# Patient Record
Sex: Female | Born: 1935
Health system: Southern US, Community
[De-identification: ages and names within clinical notes are randomized; demographics above are authoritative.]

## PROBLEM LIST (undated history)

## (undated) DIAGNOSIS — I10 Essential (primary) hypertension: Secondary | ICD-10-CM

## (undated) DIAGNOSIS — I129 Hypertensive chronic kidney disease with stage 1 through stage 4 chronic kidney disease, or unspecified chronic kidney disease: Secondary | ICD-10-CM

## (undated) DIAGNOSIS — Z79899 Other long term (current) drug therapy: Secondary | ICD-10-CM

## (undated) DIAGNOSIS — G459 Transient cerebral ischemic attack, unspecified: Secondary | ICD-10-CM

## (undated) DIAGNOSIS — E785 Hyperlipidemia, unspecified: Secondary | ICD-10-CM

## (undated) DIAGNOSIS — E7800 Pure hypercholesterolemia, unspecified: Secondary | ICD-10-CM

## (undated) DIAGNOSIS — N183 Chronic kidney disease, stage 3 unspecified: Secondary | ICD-10-CM

## (undated) DIAGNOSIS — F411 Generalized anxiety disorder: Secondary | ICD-10-CM

## (undated) DIAGNOSIS — R51 Headache: Secondary | ICD-10-CM

## (undated) DIAGNOSIS — E119 Type 2 diabetes mellitus without complications: Secondary | ICD-10-CM

## (undated) DIAGNOSIS — Z8673 Personal history of transient ischemic attack (TIA), and cerebral infarction without residual deficits: Secondary | ICD-10-CM

## (undated) DIAGNOSIS — I739 Peripheral vascular disease, unspecified: Secondary | ICD-10-CM

## (undated) HISTORY — DX: Essential (primary) hypertension: I10

## (undated) HISTORY — DX: Generalized anxiety disorder: F41.1

## (undated) HISTORY — DX: Hypertensive chronic kidney disease with stage 1 through stage 4 chronic kidney disease, or unspecified chronic kidney disease: I12.9

## (undated) HISTORY — DX: Transient cerebral ischemic attack, unspecified: G45.9

## (undated) HISTORY — DX: Personal history of transient ischemic attack (TIA), and cerebral infarction without residual deficits: Z86.73

## (undated) HISTORY — DX: Chronic kidney disease, stage 3 unspecified: N18.30

## (undated) HISTORY — DX: Headache: R51

## (undated) HISTORY — DX: Type 2 diabetes mellitus without complications: E11.9

## (undated) HISTORY — PX: OTHER SURGICAL HISTORY: SHX169

## (undated) HISTORY — PX: CHOLECYSTECTOMY: SHX55

## (undated) HISTORY — DX: Other long term (current) drug therapy: Z79.899

## (undated) HISTORY — DX: Peripheral vascular disease, unspecified: I73.9

## (undated) HISTORY — DX: Chronic kidney disease, stage 3 (moderate): N18.3

## (undated) HISTORY — DX: Hyperlipidemia, unspecified: E78.5

## (undated) HISTORY — DX: Pure hypercholesterolemia, unspecified: E78.00

---

## 1999-03-05 ENCOUNTER — Encounter: Payer: Self-pay | Admitting: Cardiology

## 1999-03-05 ENCOUNTER — Ambulatory Visit (HOSPITAL_COMMUNITY): Admission: RE | Admit: 1999-03-05 | Discharge: 1999-03-05 | Payer: Self-pay | Admitting: Cardiology

## 2006-08-24 ENCOUNTER — Ambulatory Visit: Payer: Self-pay | Admitting: Cardiology

## 2006-12-08 ENCOUNTER — Ambulatory Visit: Payer: Self-pay | Admitting: Cardiology

## 2006-12-23 ENCOUNTER — Ambulatory Visit: Payer: Self-pay | Admitting: Internal Medicine

## 2006-12-23 ENCOUNTER — Ambulatory Visit (HOSPITAL_COMMUNITY): Admission: RE | Admit: 2006-12-23 | Discharge: 2006-12-23 | Payer: Self-pay | Admitting: Cardiology

## 2007-01-07 ENCOUNTER — Ambulatory Visit: Payer: Self-pay | Admitting: Cardiology

## 2008-04-17 ENCOUNTER — Ambulatory Visit: Payer: Self-pay | Admitting: Cardiology

## 2010-07-09 NOTE — Assessment & Plan Note (Signed)
Dana [name]                          Dana [name]   Dana, [name]                  MRN:          [id number]  DATE:01/07/2007                            DOB:          Apr 05, 1927    The patient is seen for follow-up.  See my complete [name] of December 08, 2006.  At that time we decided to proceed with a CPX study with [name]. Bensimhon, M.D.  I do not have the formal report yet.  I do have a  preliminary report.  She actually had exercise tolerance that was  adequate compared to her age.  There was a mild ventilatory defect.  There may have been some circulatory defect.  I will need a more formal  final report and I will obtain this.  In the meantime, she felt much  better when she was pushed on the exercise study and realized that she  could exercise further and she has been going about more complete  activities.  She has not had any chest pain.  She has not had any  presyncope or syncope.   Since seeing her last, she had an episode when being asked some  questions on the telephone of decreased memory and then this became  worse as the conversation went on.  She says that Dr. Sherril Croon said she may  have had a small stroke.  On that day, around that time, she had cut  back her Aggrenox dose.   Her function has returned and she is going about full activities.   ALLERGIES:  No known drug allergies.   MEDICATIONS:  1. Aggrenox.  2. Diovan hydrochlorothiazide.   PAST MEDICAL HISTORY:  See the [name] with extensive listing on December 08, 2006.   REVIEW OF SYSTEMS:  She is doing well at this time.  Otherwise, review  of systems is negative.   PHYSICAL EXAMINATION:  VITAL SIGNS:  Blood pressure 140/78, pulse 86.  GENERAL:  The patient is oriented to person, time, and place.  Affect is  normal.  She is here with a family member today.  HEENT:  No xanthelasma.  She has normal extraocular motion.  LUNGS:  Clear.  Respiratory  effort is not labored.  HEART:  S1 with S2.  There are no clicks or significant murmurs.  ABDOMEN:  Soft.  She has no masses or bruits.   The problems are listed on my [name] of December 08, 2006.  Her shortness  of breath seems to be somewhat better.  I need to re-review her CPX with  Dr. Gala Romney to be sure that I am fully up  to date on all aspects of the results.  At this point, she is stable.  I  do want to see her back and we will arrange follow-up in three months.     Luis Abed, MD, [facility]  Electronically Signed    JDK/MedQ  DD: 01/07/2007  DT: 01/08/2007  Job #: [id number]   cc:   Doreen Beam

## 2010-07-09 NOTE — Assessment & Plan Note (Signed)
[facility]                          [facility]   Dana [name], Dana [name]                  MRN:          [id number]  DATE:12/08/2006                            DOB:          [birth year]    Dana [name] is referred by Dr. Sherril Croon for the followup of chest  tightness and shortness of breath.  He has been very carefully  evaluating this.  She had a 2D echocardiogram on August 17, 2006 that  showed some mild left ventricular hypertrophy and an ejection fraction  of 60%.  There is an atrial septal aneurysm, but no PFO and no ASD.  Right ventricular function was good.  She also underwent a stress  nuclear scan.  This study was done on August 24, 2006.  She walked on the  treadmill for 3 minutes 15 seconds.  She had shortness of breath and  felt poorly.  This is how she feels when she tries to get around.  At  that time, she had an ejection fraction of 70%.  There was no diagnostic  EKG change.  The nuclear images were normal.  There was no scar or  ischemia, and there was normal wall motion.  The patient had a CT scan  dated November 19, 2006.  This study showed that there was no aneurysm.  There was no evidence of pulmonary emboli.   The patient continues to feel this way, and she is limited by it.  She  does not have syncope or presyncope.  She saw Dr. Sherril Croon yesterday, and he  was concerned about her, and she is here for complete cardiac evaluation  now.  It is of note that the patient has had some chest pain and  question of an abnormal stress test in 2001, and actually underwent  cardiac catheterization.  The catheterization showed normal coronary  arteries and normal LV function.   PAST MEDICAL HISTORY:   ALLERGIES:  No known drug allergies.   MEDICATIONS:  Aggrenox, and Diovan/hydrochlorothiazide.   OTHER MEDICAL PROBLEMS:  See the list below.   REVIEW OF SYSTEMS:  She does not have any other complaints at this time.  She has no GI  or GU complaints.  Her review of systems otherwise is  negative.   PHYSICAL EXAMINATION:  VITAL SIGNS:  Blood pressure 135/68, with a pulse  of 78.  GENERAL:  The patient is oriented to person, time, and place.  Affect is  normal.  HEENT:  Reveals no xanthelasma.  She has normal extraocular motion.  NECK:  There are no carotid bruits.  There is no jugular venous  distention.  LUNGS:  Clear.  There is no respiratory distress.  CARDIAC:  Reveals an S1 with an S2.  There are no clicks or significant  murmurs.  ABDOMEN:  Soft.  There are no masses or bruits.  There are normal bowel  sounds.  EXTREMITIES:  There is no peripheral edema.  MUSCULOSKELETAL:  There are no musculoskeletal deformities.   EKGs sent to me reveal some mild nonspecific ST changes.   PROBLEM LIST:  1. Normal left ventricular function by echocardiogram on  August 17, 2006.  2. Atrial septal aneurysm, with no patent foramen ovale and no atrial      septal defect.  3. Mild left ventricular hypertrophy.  4. Poor exercise tolerance test by stress Cardiolite in June 2008.  5. No evidence of ischemia by Cardiolite in June 2008.  6. Question of a transient ischemic attack in the past.  7. Hypertension, treated.  8. LDL of 148.  This is on labs in August 2008.  Consideration to      adding a statin would certainly be reasonable.  9. History of normal coronary arteries by catheterization in 2001.  10.**Current exertional shortness of breath and chest tightness.  At      this point, the etiology is not clear.  I am not inclined to      recommend repeat catheterization.  She had this in 2001, and her      most recent Cardiolite revealed no ischemia when she felt poorly on      the treadmill.  I feel that a full cardiopulmonary exercise test      evaluated by Dr. Gala Romney would be very helpful.  I have explained      this to her, and this will be arranged.  I will then see her back      for followup.  11.History of  cholecystectomy.  12.Question of diabetes, diet controlled.     Luis Abed, MD, [facility]  Electronically Signed    JDK/MedQ  DD: 12/08/2006  DT: 12/09/2006  Job #: [id number]   cc:   Doreen Beam

## 2010-07-12 NOTE — Cardiovascular Report (Signed)
[facility]  Patient:    Dana [name], Dana [name]                    MRN: [id number]. Date: 03/05/99 Attending:  Cecil Cranker, M.D. [facility] CC:         Luis Abed, M.D. [facility]             Lia Hopping, M.D. - [place], [place]             Cardiac Catheterization Laboratory                        Cardiac Catheterization  PROCEDURE:  Selective coronary angiography, left ventricular angiography, abdominal aortography.  CARDIOLOGIST:  Cecil Cranker, M.D.  INDICATIONS:  The patient is a 77 year old white female with chest pain and abnormal stress test.  She has multiple risk factors.  RESULTS: LVEDP:  20.  There is no gradient on pullback from the left ventricle to the aorta.  ANGIOGRAPHY: 1. Left main coronary artery:  Is normal. 2. Left anterior descending coronary artery:  Is normal. 3. Circumflex coronary artery:  Normal. 4. Right coronary artery:  Normal. 5. Left ventricle:  Normal.  ABDOMINAL AORTOGRAPHY:  Normal.  SUMMARY: 1. Normal selective coronary angiography. 2. Normal left ventricular angiography. 3. Normal abdominal aortography. DD:  03/05/99 TD:  03/05/99 Job: [id number]

## 2010-07-12 NOTE — Assessment & Plan Note (Signed)
[facility]                          [facility]   NIMA, BAMBURG                  MRN:          [id number]  DATE:01/14/2007                            DOB:          [birth year]    I have discussed the CPX with [name]. Bensimhon, M.D. today.  The  study shows:  1. Normal functional capacity for her age.  2. Mild ventilatory abnormality probably a restrictive abnormality.  3. Mild circulatory abnormality probably from diastolic dysfunction.   Considering all of the issues and the notes in my problem list, it would  appear that the patient is not having significant ischemic as the basis  of her shortness of breath.  At this point the recommendation will be  to:  1. Not proceed with cardiac catheterization.  2. Consider pulmonary rehab.   I will see her back and discuss this.   The patient has normal functional capacity when compared to sedentary  patients of her age.  Considering all data, she appears to be stable  enough to continue with medical therapy without further aggressive  cardiac diagnostic studies.     Luis Abed, MD, [facility]  Electronically Signed    JDK/MedQ  DD: 01/14/2007  DT: 01/15/2007  Job #: [id number]

## 2010-07-12 NOTE — Assessment & Plan Note (Signed)
[facility]                          [facility]   ORLI, DEGRAVE                  MRN:          [id number]  DATE:02/02/2007                            DOB:          November 04, 1927    See my dictated note of January 14, 2007.  I called Ms. Empson  today.  I apologized for the late phone call, but explained the results  of her CPX to her.  She is doing well.  She is actually walking on the  treadmill at times.  I told her that I felt that her functional capacity  was good and that we did not need a heart catheterization, and that she  should go about full activities.  She seemed pleased by this.  I will  plan to see her back in the office in approximately 3 months, and I will  call the office to have this arranged.     Luis Abed, MD, [facility]  Electronically Signed    JDK/MedQ  DD: 02/02/2007  DT: 02/02/2007  Job #: [id number]

## 2011-10-30 DIAGNOSIS — R079 Chest pain, unspecified: Secondary | ICD-10-CM

## 2011-10-31 ENCOUNTER — Encounter: Payer: Self-pay | Admitting: Internal Medicine

## 2011-10-31 DIAGNOSIS — R079 Chest pain, unspecified: Secondary | ICD-10-CM

## 2011-11-05 ENCOUNTER — Encounter: Payer: Self-pay | Admitting: Physician Assistant

## 2011-11-05 DIAGNOSIS — R079 Chest pain, unspecified: Secondary | ICD-10-CM

## 2011-11-12 ENCOUNTER — Telehealth: Payer: Self-pay | Admitting: *Deleted

## 2011-11-12 NOTE — Telephone Encounter (Signed)
Message copied by Eustace Moore on Wed Nov 12, 2011  4:27 PM ------      Message from: Rande Brunt      Created: Fri Nov 07, 2011  1:31 PM       Abnormal stress test result. Need to review at post hospital f/u, to discuss possible cardiac cath. JK is primary MD, and she should have scheduled early f/u

## 2011-11-12 NOTE — Telephone Encounter (Signed)
Patient informed. 

## 2011-11-14 ENCOUNTER — Ambulatory Visit (INDEPENDENT_AMBULATORY_CARE_PROVIDER_SITE_OTHER): Payer: Medicare Other | Admitting: Physician Assistant

## 2011-11-14 ENCOUNTER — Encounter: Payer: Self-pay | Admitting: Physician Assistant

## 2011-11-14 ENCOUNTER — Other Ambulatory Visit: Payer: Self-pay | Admitting: Physician Assistant

## 2011-11-14 ENCOUNTER — Telehealth: Payer: Self-pay

## 2011-11-14 ENCOUNTER — Encounter: Payer: Self-pay | Admitting: *Deleted

## 2011-11-14 VITALS — BP 136/76 | HR 71 | Ht 63.0 in | Wt 132.0 lb

## 2011-11-14 DIAGNOSIS — E119 Type 2 diabetes mellitus without complications: Secondary | ICD-10-CM

## 2011-11-14 DIAGNOSIS — I10 Essential (primary) hypertension: Secondary | ICD-10-CM

## 2011-11-14 DIAGNOSIS — N183 Chronic kidney disease, stage 3 unspecified: Secondary | ICD-10-CM

## 2011-11-14 DIAGNOSIS — R079 Chest pain, unspecified: Secondary | ICD-10-CM

## 2011-11-14 DIAGNOSIS — R072 Precordial pain: Secondary | ICD-10-CM

## 2011-11-14 DIAGNOSIS — Z01810 Encounter for preprocedural cardiovascular examination: Secondary | ICD-10-CM

## 2011-11-14 DIAGNOSIS — E7800 Pure hypercholesterolemia, unspecified: Secondary | ICD-10-CM | POA: Insufficient documentation

## 2011-11-14 LAB — PROTIME-INR

## 2011-11-14 NOTE — Patient Instructions (Addendum)
   Left JV heart cath - see info sheet given  Increase fluid intake  Follow up will be given after above

## 2011-11-14 NOTE — Assessment & Plan Note (Signed)
Currently stable on no medical therapy

## 2011-11-14 NOTE — Telephone Encounter (Signed)
Left JV heart cath - Wednesday, 9/25 - 7:30 - cooper

## 2011-11-14 NOTE — Progress Notes (Signed)
Primary Cardiologist: Jerral Bonito, MD   HPI: Post hospital followup from [facility], recently seen in consultation for exertional CP. Patient presented with history of normal cardiac catheterization in 2001, following false-positive stress test. She had a normal GXT Myoview, 9/11, at Dr. Sherril Croon' office. She was last seen here in our clinic in 2008, by Dr. Myrtis Ser.  Patient ruled out for MI with normal cardiac markers. 2-D echo indicated EF greater than 65%, with diastolic dysfunction, no significant valvular abnormalities.  Patient was cleared to proceed with outpatient GXT Myoview, completed 9/11, and reviewed by Dr. Diona Browner.   - Equivocal LV perfusion with small, inferobasal reversible defect - question mild ischemia; EF 86%  Since her recent hospitalization, patient has refrained from any strenuous activity. Nevertheless, she does fatigue easily, even just after 15 minutes of doing yard work. She also did experience recurrent angina pectoris at peak exercise, during her recent stress test. In summary, she states that she has noted a definite decrease in exercise tolerance over these past 3 months, or so. In fact, she is reluctant to push herself beyond doing minimal household activities, knowing that doing so would precipitate her chest pain.  12-lead EKG today, reviewed by me, indicates NSR 72 bpm; LAD; no acute changes  Allergies  Allergen Reactions  . Paxil (Paroxetine Hcl) Other (See Comments)    Made her feel very tired    Current Outpatient Prescriptions  Medication Sig Dispense Refill  . dipyridamole-aspirin (AGGRENOX) 200-25 MG per 12 hr capsule Take 1 capsule by mouth 2 (two) times daily.      . Multiple Vitamins-Minerals (CENTRUM SILVER ADULT 50+ PO) Take 1 tablet by mouth daily.      . pantoprazole (PROTONIX) 40 MG tablet Take 40 mg by mouth daily.      . simvastatin (ZOCOR) 20 MG tablet Take 20 mg by mouth every evening.        Past Medical History  Diagnosis Date  . Unspecified  transient cerebral ischemia   . Pure hypercholesterolemia   . Type II or unspecified type diabetes mellitus without mention of complication, not stated as uncontrolled   . Encounter for long-term (current) use of other medications   . Unspecified hypertensive kidney disease with chronic kidney disease stage I through stage IV, or unspecified   . Chronic kidney disease, stage III (moderate)   . Other and unspecified hyperlipidemia   . Peripheral vascular disease, unspecified   . Headache   . Anxiety state, unspecified   . Transient ischemic attack (TIA), and cerebral infarction without residual deficits   . HTN (hypertension)     Past Surgical History  Procedure Date  . Cholecystectomy   . Cataract surgery     History   Social History  . Marital Status: Divorced    Spouse Name: N/A    Number of Children: N/A  . Years of Education: N/A   Occupational History  . Not on file.   Social History Main Topics  . Smoking status: Never Smoker   . Smokeless tobacco: Never Used  . Alcohol Use: Not on file  . Drug Use: Not on file  . Sexually Active: Not on file   Other Topics Concern  . Not on file   Social History Narrative  . No narrative on file    No family history on file.  ROS: no nausea, vomiting; no fever, chills; no melena, hematochezia; no claudication  PHYSICAL EXAM: BP 136/76  Pulse 71  Ht 5\' 3"  (1.6 m)  Wt 132 lb (59.875 kg)  BMI 23.38 kg/m2 GENERAL: 78 year old female; NAD HEENT: NCAT, PERRLA, EOMI; sclera clear; no xanthelasma NECK: palpable bilateral carotid pulses, no bruits; no JVD; no TM LUNGS: CTA bilaterally CARDIAC: RRR (S1, S2); no significant murmurs; no rubs or gallops ABDOMEN: soft, non-tender; intact BS EXTREMETIES: intact distal pulses; no significant peripheral edema SKIN: warm/dry; no obvious rash/lesions MUSCULOSKELETAL: no joint deformity NEURO: no focal deficit; NL affect   EKG: reviewed and available in Electronic  Records   ASSESSMENT & PLAN:  Chest pain Recommendation is to proceed with diagnostic coronary angiography to rule out significant CAD. This will be scheduled early next week in our JV catheterization lab. The patient is agreeable with this recommendation, and the risks/benefits were discussed in conjunction with Dr. Willa Rough. Patient has normal LVF by echocardiography, and LVG will be deferred (secondary to CKD)  HTN (hypertension) Currently stable on no medical therapy  Pure hypercholesterolemia Continue current dose simvastatin, pending results of catheterization.  Type II or unspecified type diabetes mellitus without mention of complication, not stated as uncontrolled Followed by primary M.D.  Chronic kidney disease, stage III (moderate) In hospital labs notable for creatinine 1.4 (GFR 36) Patient has normal LVF by echocardiography, and LVG will be deferred.    Gene Avy Barlett, PAC

## 2011-11-14 NOTE — Assessment & Plan Note (Addendum)
Recommendation is to proceed with diagnostic coronary angiography to rule out significant CAD. This will be scheduled early next week in our JV catheterization lab. The patient is agreeable with this recommendation, and the risks/benefits were discussed in conjunction with Dr. Willa Rough. Patient has normal LVF by echocardiography, and LVG will be deferred (secondary to CKD)

## 2011-11-14 NOTE — Assessment & Plan Note (Signed)
Continue current dose simvastatin, pending results of catheterization.

## 2011-11-14 NOTE — Assessment & Plan Note (Signed)
Followed by primary M.D. 

## 2011-11-14 NOTE — Assessment & Plan Note (Signed)
In hospital labs notable for creatinine 1.4 (GFR 36) Patient has normal LVF by echocardiography, and LVG will be deferred.

## 2011-11-18 NOTE — Telephone Encounter (Signed)
Authorized.  See media mgr

## 2011-11-19 ENCOUNTER — Encounter (HOSPITAL_BASED_OUTPATIENT_CLINIC_OR_DEPARTMENT_OTHER)
Admission: RE | Disposition: A | Discharge status: Home / Self Care | Payer: Self-pay | Source: Ambulatory Visit | Attending: Cardiovascular Disease

## 2011-11-19 ENCOUNTER — Encounter (HOSPITAL_BASED_OUTPATIENT_CLINIC_OR_DEPARTMENT_OTHER): Payer: Self-pay | Admitting: *Deleted

## 2011-11-19 ENCOUNTER — Inpatient Hospital Stay (HOSPITAL_BASED_OUTPATIENT_CLINIC_OR_DEPARTMENT_OTHER)
Admission: RE | Admit: 2011-11-19 | Discharge: 2011-11-19 | Disposition: A | Discharge status: Home / Self Care | Payer: Medicare Other | Source: Ambulatory Visit | Attending: Cardiovascular Disease | Admitting: Cardiovascular Disease

## 2011-11-19 DIAGNOSIS — I739 Peripheral vascular disease, unspecified: Secondary | ICD-10-CM | POA: Insufficient documentation

## 2011-11-19 DIAGNOSIS — I2510 Atherosclerotic heart disease of native coronary artery without angina pectoris: Secondary | ICD-10-CM | POA: Insufficient documentation

## 2011-11-19 DIAGNOSIS — I129 Hypertensive chronic kidney disease with stage 1 through stage 4 chronic kidney disease, or unspecified chronic kidney disease: Secondary | ICD-10-CM | POA: Insufficient documentation

## 2011-11-19 DIAGNOSIS — E785 Hyperlipidemia, unspecified: Secondary | ICD-10-CM | POA: Insufficient documentation

## 2011-11-19 DIAGNOSIS — R079 Chest pain, unspecified: Secondary | ICD-10-CM

## 2011-11-19 DIAGNOSIS — E7800 Pure hypercholesterolemia, unspecified: Secondary | ICD-10-CM | POA: Insufficient documentation

## 2011-11-19 DIAGNOSIS — E119 Type 2 diabetes mellitus without complications: Secondary | ICD-10-CM | POA: Insufficient documentation

## 2011-11-19 DIAGNOSIS — Z8673 Personal history of transient ischemic attack (TIA), and cerebral infarction without residual deficits: Secondary | ICD-10-CM | POA: Insufficient documentation

## 2011-11-19 DIAGNOSIS — N183 Chronic kidney disease, stage 3 unspecified: Secondary | ICD-10-CM | POA: Insufficient documentation

## 2011-11-19 SURGERY — JV LEFT HEART CATHETERIZATION WITH CORONARY ANGIOGRAM
Anesthesia: Moderate Sedation

## 2011-11-19 MED ORDER — ONDANSETRON HCL 4 MG/2ML IJ SOLN: 4.0000 mg | Freq: Four times a day (QID) | INTRAMUSCULAR | Status: DC | PRN

## 2011-11-19 MED ORDER — ONDANSETRON HCL 4 MG/2ML IJ SOLN
4.0000 mg | Freq: Three times a day (TID) | INTRAMUSCULAR | Status: DC | PRN
  Administered 2011-11-19: 4 mg via INTRAVENOUS

## 2011-11-19 MED ORDER — DIAZEPAM 5 MG PO TABS
5.0000 mg | ORAL_TABLET | Freq: Once | ORAL | Status: AC
  Administered 2011-11-19: 5 mg via ORAL

## 2011-11-19 MED ORDER — SODIUM CHLORIDE 0.9 % IV SOLN
INTRAVENOUS | Status: DC
  Administered 2011-11-19: 07:00:00 via INTRAVENOUS

## 2011-11-19 MED ORDER — SODIUM CHLORIDE 0.9 % IJ SOLN: 3.0000 mL | INTRAMUSCULAR | Status: DC | PRN

## 2011-11-19 MED ORDER — ACETAMINOPHEN 325 MG PO TABS: 650.0000 mg | ORAL_TABLET | ORAL | Status: DC | PRN

## 2011-11-19 MED ORDER — SODIUM CHLORIDE 0.9 % IJ SOLN: 3.0000 mL | Freq: Two times a day (BID) | INTRAMUSCULAR | Status: DC

## 2011-11-19 MED ORDER — SODIUM CHLORIDE 0.9 % IV SOLN: 1.0000 mL/kg/h | INTRAVENOUS | Status: DC

## 2011-11-19 MED ORDER — SODIUM CHLORIDE 0.9 % IV SOLN
250.0000 mL | INTRAVENOUS | Status: DC | PRN
Start: 2011-11-19 — End: 2011-11-19

## 2011-11-19 MED ORDER — ASPIRIN 81 MG PO CHEW
324.0000 mg | CHEWABLE_TABLET | ORAL | Status: AC
  Administered 2011-11-19: 324 mg via ORAL

## 2011-11-19 NOTE — Progress Notes (Signed)
Dr. Excell Seltzer in to look at small hematoma.  Pressure actually held for 30 mins. pressurs dressing applied.

## 2011-11-19 NOTE — Progress Notes (Signed)
Ambulated to bathroom without further bleeding from right groin site.  Site remains a level 1.  Sent home with tegaderm and pressurse dressing.

## 2011-11-19 NOTE — Progress Notes (Signed)
Bedrest begins @ 0830.  Tegaderm dressing applied by Ashley Murrain, right groin site level 0.

## 2011-11-19 NOTE — CV Procedure (Signed)
   Cardiac Catheterization Procedure Note  Name: Dana [name] MRN: [id number] DOB: May 08, 1927  Procedure: Left Heart Cath, Selective Coronary Angiography, LV angiography  Indication: Exertional chest pain with CCS class III angina and an abnormal nuclear stress test   Procedural details: The right groin was prepped, draped, and anesthetized with 1% lidocaine. Using modified Seldinger technique, a 5 French sheath was introduced into the right femoral artery. Standard Judkins catheters were used for coronary angiography and left ventriculography. Catheter exchanges were performed over a guidewire. There were no immediate procedural complications. The patient was transferred to the post catheterization recovery area for further monitoring.  Procedural Findings: Hemodynamics:  AO 102/44 with a mean of 70 LV 94/13   Coronary angiography: Coronary dominance: right  Left mainstem: Widely patent without obstructive disease  Left anterior descending (LAD): Large-caliber vessel patent to the LV apex. The mid LAD has calcification with mild nonobstructive plaque estimated at 30% stenosis. There is intramyocardial bridging further down in the mid LAD. There is no high-grade obstructive disease throughout the course of the LAD  Left circumflex (LCx): Widely patent vessel supplying a small first OM branch and larger second and third OM branches without significant stenoses.  Right coronary artery (RCA): Dominant, moderate caliber vessel. Widely patent throughout. There is acute marginal branch, PDA branch, and very small PLA branch. There were no significant stenoses.  Left ventriculography: Deferred secondary to renal insufficiency  Total contrast: 40 cc  Final Conclusions:   1. Nonobstructive coronary artery disease, primarily involving the LAD 2. Angiographically normal left circumflex and right coronary artery 3. Normal left ventricular filling pressure  Recommendations: Although  patient's symptoms are typical of angina, I do not appreciate any significant obstructive CAD.  [name] 11/19/2011, 8:10 AM

## 2011-11-19 NOTE — H&P (View-Only) (Signed)
Primary Cardiologist: Jeff Katz, MD   HPI: Post hospital followup from [facility], recently seen in consultation for exertional CP. Patient presented with history of normal cardiac catheterization in 2001, following false-positive stress test. She had a normal GXT Myoview, 9/11, at Dr. Vyas' office. She was last seen here in our clinic in 2008, by Dr. Katz.  Patient ruled out for MI with normal cardiac markers. 2-D echo indicated EF greater than 65%, with diastolic dysfunction, no significant valvular abnormalities.  Patient was cleared to proceed with outpatient GXT Myoview, completed 9/11, and reviewed by Dr. McDowell.   - Equivocal LV perfusion with small, inferobasal reversible defect - question mild ischemia; EF 86%  Since her recent hospitalization, patient has refrained from any strenuous activity. Nevertheless, she does fatigue easily, even just after 15 minutes of doing yard work. She also did experience recurrent angina pectoris at peak exercise, during her recent stress test. In summary, she states that she has noted a definite decrease in exercise tolerance over these past 3 months, or so. In fact, she is reluctant to push herself beyond doing minimal household activities, knowing that doing so would precipitate her chest pain.  12-lead EKG today, reviewed by me, indicates NSR 72 bpm; LAD; no acute changes  Allergies  Allergen Reactions  . Paxil (Paroxetine Hcl) Other (See Comments)    Made her feel very tired    Current Outpatient Prescriptions  Medication Sig Dispense Refill  . dipyridamole-aspirin (AGGRENOX) 200-25 MG per 12 hr capsule Take 1 capsule by mouth 2 (two) times daily.      . Multiple Vitamins-Minerals (CENTRUM SILVER ADULT 50+ PO) Take 1 tablet by mouth daily.      . pantoprazole (PROTONIX) 40 MG tablet Take 40 mg by mouth daily.      . simvastatin (ZOCOR) 20 MG tablet Take 20 mg by mouth every evening.        Past Medical History  Diagnosis Date  . Unspecified  transient cerebral ischemia   . Pure hypercholesterolemia   . Type II or unspecified type diabetes mellitus without mention of complication, not stated as uncontrolled   . Encounter for long-term (current) use of other medications   . Unspecified hypertensive kidney disease with chronic kidney disease stage I through stage IV, or unspecified   . Chronic kidney disease, stage III (moderate)   . Other and unspecified hyperlipidemia   . Peripheral vascular disease, unspecified   . Headache   . Anxiety state, unspecified   . Transient ischemic attack (TIA), and cerebral infarction without residual deficits   . HTN (hypertension)     Past Surgical History  Procedure Date  . Cholecystectomy   . Cataract surgery     History   Social History  . Marital Status: Divorced    Spouse Name: N/A    Number of Children: N/A  . Years of Education: N/A   Occupational History  . Not on file.   Social History Main Topics  . Smoking status: Never Smoker   . Smokeless tobacco: Never Used  . Alcohol Use: Not on file  . Drug Use: Not on file  . Sexually Active: Not on file   Other Topics Concern  . Not on file   Social History Narrative  . No narrative on file    No family history on file.  ROS: no nausea, vomiting; no fever, chills; no melena, hematochezia; no claudication  PHYSICAL EXAM: BP 136/76  Pulse 71  Ht 5' 3" (1.6 m)    Wt 132 lb (59.875 kg)  BMI 23.38 kg/m2 GENERAL: 78-year-old female; NAD HEENT: NCAT, PERRLA, EOMI; sclera clear; no xanthelasma NECK: palpable bilateral carotid pulses, no bruits; no JVD; no TM LUNGS: CTA bilaterally CARDIAC: RRR (S1, S2); no significant murmurs; no rubs or gallops ABDOMEN: soft, non-tender; intact BS EXTREMETIES: intact distal pulses; no significant peripheral edema SKIN: warm/dry; no obvious rash/lesions MUSCULOSKELETAL: no joint deformity NEURO: no focal deficit; NL affect   EKG: reviewed and available in Electronic  Records   ASSESSMENT & PLAN:  Chest pain Recommendation is to proceed with diagnostic coronary angiography to rule out significant CAD. This will be scheduled early next week in our JV catheterization lab. The patient is agreeable with this recommendation, and the risks/benefits were discussed in conjunction with Dr. Jeffrey Katz. Patient has normal LVF by echocardiography, and LVG will be deferred (secondary to CKD)  HTN (hypertension) Currently stable on no medical therapy  Pure hypercholesterolemia Continue current dose simvastatin, pending results of catheterization.  Type II or unspecified type diabetes mellitus without mention of complication, not stated as uncontrolled Followed by primary M.D.  Chronic kidney disease, stage III (moderate) In hospital labs notable for creatinine 1.4 (GFR 36) Patient has normal LVF by echocardiography, and LVG will be deferred.    Gene Hondo Nanda, PAC  

## 2011-11-19 NOTE — Progress Notes (Signed)
Noticed a small hematoma on right groin site about 2 inches by 2 inches.  Pressurse held for 15 minutes and a pressure bandage applied.

## 2011-11-19 NOTE — Interval H&P Note (Signed)
History and Physical Interval Note:  11/19/2011 7:44 AM  Dana [name]  has presented today for surgery, with the diagnosis of cp  The various methods of treatment have been discussed with the patient and family. After consideration of risks, benefits and other options for treatment, the patient has consented to  Procedure(s) (LRB) with comments: JV LEFT HEART CATHETERIZATION WITH CORONARY ANGIOGRAM (N/A) as a surgical intervention .  The patient's history has been reviewed, patient examined, no change in status, stable for surgery.  I have reviewed the patient's chart and labs.  Questions were answered to the patient's satisfaction.     [name]

## 2013-09-01 ENCOUNTER — Encounter: Payer: Self-pay | Admitting: Neurology

## 2013-09-01 ENCOUNTER — Ambulatory Visit (INDEPENDENT_AMBULATORY_CARE_PROVIDER_SITE_OTHER): Payer: Medicare Other | Admitting: Neurology

## 2013-09-01 VITALS — BP 134/64 | HR 69 | Resp 16 | Ht 63.0 in | Wt 135.0 lb

## 2013-09-01 DIAGNOSIS — R413 Other amnesia: Secondary | ICD-10-CM

## 2013-09-01 DIAGNOSIS — M792 Neuralgia and neuritis, unspecified: Secondary | ICD-10-CM

## 2013-09-01 DIAGNOSIS — R51 Headache: Secondary | ICD-10-CM

## 2013-09-01 DIAGNOSIS — IMO0002 Reserved for concepts with insufficient information to code with codable children: Secondary | ICD-10-CM

## 2013-09-01 DIAGNOSIS — R519 Headache, unspecified: Secondary | ICD-10-CM | POA: Insufficient documentation

## 2013-09-01 DIAGNOSIS — R4182 Altered mental status, unspecified: Secondary | ICD-10-CM

## 2013-09-01 NOTE — Patient Instructions (Signed)
1. MRI brain without contrast 2. Routine EEG 3. Consider gabapentin if scan is normal 4. Follow-up in 1 month

## 2013-09-01 NOTE — Progress Notes (Signed)
NEUROLOGY CONSULTATION NOTE  Dana [name] MRN: [id number] DOB: May 28, 1927  Referring provider: Dr. Jerene Bears Primary care provider: Dr. Jerene Bears  Reason for consult:  headaches  Dear Dr Woody Seller:  Thank you for your kind referral of Dana [name] for consultation of the above symptoms. Although her history is well known to you, please allow me to reiterate it for the purpose of our medical record. The patient was accompanied to the clinic by her daughter who also provides collateral information. Records and images were personally reviewed where available.  HISTORY OF PRESENT ILLNESS: This is a pleasant 80 year old right-handed woman with a history of hyperlipidemia, TIAs in 2006, presenting for new onset headaches that started 3 weeks ago.  She ports a squeezing pressure pain over the right vertex region, feeling like "a swelling that would explode."  If severe, she would have nausea and feel like she would pass out.  Interestingly, the pain seems to occur every time she is exposed to the sun.  When she goes in the shade, the pain resolves.  She has stayed out of the sun and wears a hat now. Yesterday she had to take an aspirin and go to sleep.  She has also been having a different kind of headache that she notices when she is standing in line with friends ordering food.  She would get a diffuse pressure in her head. She sits down and the pain resolves.  She denies any vomiting, visual changes, diplopia, dizziness, dysarthria, dysphagia, jaw claudication. No neck/back pain, focal numbness/tingling/weakness, no bowel/bladder dysfunction. She has no prior history of migraines, no family history of similar symptoms. She denies any recent changes in medications.  She has been taking Aggrenox since 2006 when she had a TIA where her speech became slurred with body weakness.  They cannot recall if weakness was focal, however her daughter had witnessed 3 of these episodes that lasted 15-20  minutes.  She states that she continues to have infrequent TIAs where she suddenly feels strange and that something is happening to her.  She reports her memory gets messed up, she cannot recall names and numbers, lasting 15 minutes. She would take an aspirin.  The last episode was 2 months ago. She notes rare episodes of a strange smell lasting a few minutes, independent of the memory lapses.  They deny any staring/unresponsive episodes, no myoclonic jerks, or waking up with tongue bite or incontinence.  She had a normal birth and early development.  There is no history of febrile convulsions, CNS infections, significant traumatic brain injury, neurosurgical procedures, or family history of seizures.  Laboratory Data: None available for review  PAST MEDICAL HISTORY: Past Medical History  Diagnosis Date  . Unspecified transient cerebral ischemia   . Pure hypercholesterolemia   . Type II or unspecified type diabetes mellitus without mention of complication, not stated as uncontrolled   . Encounter for long-term (current) use of other medications   . Unspecified hypertensive kidney disease with chronic kidney disease stage I through stage IV, or unspecified   . Chronic kidney disease, stage III (moderate)   . Other and unspecified hyperlipidemia   . Peripheral vascular disease, unspecified   . Headache(784.0)   . Anxiety state, unspecified   . Transient ischemic attack (TIA), and cerebral infarction without residual deficits(V12.54)   . HTN (hypertension)     PAST SURGICAL HISTORY: Past Surgical History  Procedure Laterality Date  . Cholecystectomy    . Cataract surgery  MEDICATIONS: Current Outpatient Prescriptions on File Prior to Visit  Medication Sig Dispense Refill  . dipyridamole-aspirin (AGGRENOX) 200-25 MG per 12 hr capsule Take 1 capsule by mouth 2 (two) times daily.      . pantoprazole (PROTONIX) 40 MG tablet Take 40 mg by mouth daily.      . simvastatin (ZOCOR) 20 MG  tablet Take 20 mg by mouth every evening.      . Multiple Vitamins-Minerals (CENTRUM SILVER ADULT 50+ PO) Take 1 tablet by mouth daily.       No current facility-administered medications on file prior to visit.    ALLERGIES: Allergies  Allergen Reactions  . Paxil [Paroxetine Hcl] Other (See Comments)    Made her feel very tired    FAMILY HISTORY: Family History  Problem Relation Age of Onset  . Cancer Father   . Cancer Sister   . Cancer Brother   . Diabetes Mother   . Diabetes Father   . Cancer Daughter     SOCIAL HISTORY: History   Social History  . Marital Status: Widowed    Spouse Name: N/A    Number of Children: N/A  . Years of Education: N/A   Occupational History  . Not on file.   Social History Main Topics  . Smoking status: Never Smoker   . Smokeless tobacco: Never Used  . Alcohol Use: No  . Drug Use: No  . Sexual Activity: Not on file   Other Topics Concern  . Not on file   Social History Narrative  . No narrative on file    REVIEW OF SYSTEMS: Constitutional: No fevers, chills, or sweats, no generalized fatigue, change in appetite Eyes: No visual changes, double vision, eye pain Ear, nose and throat: No hearing loss, ear pain, nasal congestion, sore throat Cardiovascular: No chest pain, palpitations Respiratory:  No shortness of breath at rest or with exertion, wheezes GastrointestinaI: No nausea, vomiting, diarrhea, abdominal pain, fecal incontinence Genitourinary:  No dysuria, urinary retention or frequency Musculoskeletal:  No neck pain, back pain Integumentary: No rash, pruritus, skin lesions Neurological: as above Psychiatric: No depression, insomnia, anxiety Endocrine: No palpitations, fatigue, diaphoresis, mood swings, change in appetite, change in weight, increased thirst Hematologic/Lymphatic:  No anemia, purpura, petechiae. Allergic/Immunologic: no itchy/runny eyes, nasal congestion, recent allergic reactions, rashes  PHYSICAL  EXAM: Filed Vitals:   09/01/13 1022  BP: 134/64  Pulse: 69  Resp: 16   General: No acute distress Head:  Normocephalic/atraumatic, tenderness over the right occipital region. No temporal artery tenderness Eyes: Fundoscopic exam shows bilateral sharp discs, no vessel changes, exudates, or hemorrhages Neck: supple, no paraspinal tenderness, full range of motion Back: No paraspinal tenderness Heart: regular rate and rhythm Lungs: Clear to auscultation bilaterally. Vascular: No carotid bruits. Skin/Extremities: No rash, no edema Neurological Exam: Mental status: alert and oriented to person, place, and time, no dysarthria or aphasia, Fund of knowledge is appropriate.  Recent and remote memory are intact.  Attention and concentration are normal.    Able to name objects and repeat phrases. Cranial nerves: CN I: not tested CN II: pupils equal, round and reactive to light, visual fields intact, fundi unremarkable. CN III, IV, VI:  full range of motion, no nystagmus, no ptosis CN V: facial sensation intact CN VII: upper and lower face symmetric CN VIII: hearing intact to finger rub CN IX, X: gag intact, uvula midline CN XI: sternocleidomastoid and trapezius muscles intact CN XII: tongue midline Bulk & Tone: normal, no fasciculations. Motor: 5/5  throughout with no pronator drift. Sensation: decreased vibration up to both ankles, otherwise intact to light touch, cold, pin, joint position sense.  No extinction to double simultaneous stimulation.  Romberg test negative Deep Tendon Reflexes: brisk +2 throughout, no ankle clonus, negative Hoffman's sign Plantar responses: downgoing bilaterally Cerebellar: no incoordination on finger to nose, heel to shin. No dysdiadochokinesia Gait: narrow-based and steady, able to tandem walk adequately. Tremor: none  IMPRESSION: This is a pleasant 80 year old right-handed woman with a history of hyperlipidemia, TIAs that started in 2006 on Aggrenox,  presenting for new onset headaches.  MRI brain without contrast will be ordered to assess for underlying structural abnormality.  There is some tenderness to palpation over the right occipital region, suggestive of occipital neuralgia.  We discussed symptomatic treatment with gabapentin, side effects were discussed, however since symptoms appear to occur mostly when exposed to sun, she will continue with trigger avoidance for now.  The episodes described as TIA where she has brief lapses in memory with last episode 2 months ago raises the question of partial seizures.  Routine EEG will be ordered.  We discussed [place] driving laws, she knows to stop driving for any episode of loss of awareness until 6 months event-free.  She will follow-up in 1 month.  Thank you for allowing me to participate in the care of this patient. Please do not hesitate to call for any questions or concerns.   Ellouise Newer, M.D.  CC: Dr. Woody Seller

## 2013-09-06 ENCOUNTER — Ambulatory Visit (HOSPITAL_COMMUNITY)
Admission: RE | Admit: 2013-09-06 | Discharge: 2013-09-06 | Disposition: A | Discharge status: Home / Self Care | Payer: Medicare Other | Source: Ambulatory Visit | Attending: Neurology | Admitting: Neurology

## 2013-09-06 ENCOUNTER — Encounter (HOSPITAL_COMMUNITY): Payer: Self-pay

## 2013-09-06 DIAGNOSIS — IMO0002 Reserved for concepts with insufficient information to code with codable children: Secondary | ICD-10-CM | POA: Insufficient documentation

## 2013-09-06 DIAGNOSIS — R51 Headache: Secondary | ICD-10-CM | POA: Insufficient documentation

## 2013-09-06 DIAGNOSIS — R4182 Altered mental status, unspecified: Secondary | ICD-10-CM | POA: Insufficient documentation

## 2013-10-04 ENCOUNTER — Encounter: Payer: Self-pay | Admitting: Neurology

## 2013-10-04 ENCOUNTER — Ambulatory Visit (INDEPENDENT_AMBULATORY_CARE_PROVIDER_SITE_OTHER): Payer: Medicare Other | Admitting: Neurology

## 2013-10-04 VITALS — BP 130/70 | HR 70 | Temp 97.5°F | Resp 16 | Ht 62.0 in | Wt 136.4 lb

## 2013-10-04 DIAGNOSIS — R4182 Altered mental status, unspecified: Secondary | ICD-10-CM

## 2013-10-04 DIAGNOSIS — R51 Headache: Secondary | ICD-10-CM

## 2013-10-04 DIAGNOSIS — R413 Other amnesia: Secondary | ICD-10-CM

## 2013-10-04 LAB — SEDIMENTATION RATE: Sed Rate: 25 mm/hr — ABNORMAL HIGH (ref 0–22)

## 2013-10-04 MED ORDER — GABAPENTIN 300 MG PO CAPS: ORAL_CAPSULE | ORAL | Status: DC

## 2013-10-04 NOTE — Patient Instructions (Addendum)
1. Bloodwork for ESR 2. Schedule routine EEG 3. Start Gabapentin 327m: Take 1 capsule at bedtime for 1 week, then increase to 1 capsule twice a day 4. Hold off on driving when you first start the medication 5. Follow-up in 2 months

## 2013-10-04 NOTE — Progress Notes (Signed)
NEUROLOGY FOLLOW UP OFFICE NOTE  Dana [name] [id number]  HISTORY OF PRESENT ILLNESS: I had the pleasure of seeing Dana [name] in follow-up in the neurology clinic on 10/04/2013.  The patient was last seen last month for new onset headaches.  She is again accompanied by her daughter today.  Records and images were personally reviewed where available.  I personally reviewed MRI brain which did not show any acute changes, there is mild global atrophy and mild bilateral chronic microvascular change. She continues to have squeezing pressure pain in the right vertex when exposed to the sun, if severe she feels like she would pass out. This is avoided by wearing a hat, however she states she forgets to bring her hat sometimes.  No further symptoms where she suddenly feels strange and memory gets messed up, last episode occurred in April 2015. She has not scheduled the EEG yet.  HPI:  This is a pleasant 80 yo RH woman with a history of hyperlipidemia, TIAs in 2006, who presented with new onset headaches since June 2015. She reports a squeezing pressure pain over the right vertex region, feeling like "a swelling that would explode." If severe, she would have nausea and feel like she would pass out. Interestingly, the pain seems to occur every time she is exposed to the sun. When she goes in the shade, the pain resolves. She has stayed out of the sun and wears a hat now. Yesterday she had to take an aspirin and go to sleep. She has also been having a different kind of headache that she notices when she is standing in line with friends ordering food. She would get a diffuse pressure in her head. She sits down and the pain resolves. She denies any vomiting, visual changes, diplopia, dizziness, dysarthria, dysphagia, jaw claudication. No neck/back pain, focal numbness/tingling/weakness, no bowel/bladder dysfunction. She has no prior history of migraines, no family history of similar symptoms. She denies  any recent changes in medications.   She has been taking Aggrenox since 2006 when she had a TIA where her speech became slurred with body weakness. They cannot recall if weakness was focal, however her daughter had witnessed 3 of these episodes that lasted 15-20 minutes. She states that she continues to have infrequent TIAs where she suddenly feels strange and that something is happening to her. She reports her memory gets messed up, she cannot recall names and numbers, lasting 15 minutes. She would take an aspirin. The last episode was 2 months ago. She notes rare episodes of a strange smell lasting a few minutes, independent of the memory lapses. They deny any staring/unresponsive episodes, no myoclonic jerks, or waking up with tongue bite or incontinence.   She had a normal birth and early development. There is no history of febrile convulsions, CNS infections, significant traumatic brain injury, neurosurgical procedures, or family history of seizures.  PAST MEDICAL HISTORY: Past Medical History  Diagnosis Date  . Unspecified transient cerebral ischemia   . Pure hypercholesterolemia   . Type II or unspecified type diabetes mellitus without mention of complication, not stated as uncontrolled   . Encounter for long-term (current) use of other medications   . Unspecified hypertensive kidney disease with chronic kidney disease stage I through stage IV, or unspecified   . Chronic kidney disease, stage III (moderate)   . Other and unspecified hyperlipidemia   . Peripheral vascular disease, unspecified   . Headache(784.0)   . Anxiety state, unspecified   . Transient ischemic  attack (TIA), and cerebral infarction without residual deficits(V12.54)   . HTN (hypertension)     MEDICATIONS: Current Outpatient Prescriptions on File Prior to Visit  Medication Sig Dispense Refill  . dipyridamole-aspirin (AGGRENOX) 200-25 MG per 12 hr capsule Take 1 capsule by mouth 2 (two) times daily.      . Multiple  Vitamins-Minerals (CENTRUM SILVER ADULT 50+ PO) Take 1 tablet by mouth daily.      . pantoprazole (PROTONIX) 40 MG tablet Take 40 mg by mouth daily.      . simvastatin (ZOCOR) 20 MG tablet Take 20 mg by mouth every evening.      . vitamin B-12 (CYANOCOBALAMIN) 1000 MCG tablet Take 1,000 mcg by mouth daily.       No current facility-administered medications on file prior to visit.    ALLERGIES: Allergies  Allergen Reactions  . Paxil [Paroxetine Hcl] Other (See Comments)    Made her feel very tired    FAMILY HISTORY: Family History  Problem Relation Age of Onset  . Cancer Father   . Cancer Sister   . Cancer Brother   . Diabetes Mother   . Diabetes Father   . Cancer Daughter     SOCIAL HISTORY: History   Social History  . Marital Status: Widowed    Spouse Name: N/A    Number of Children: N/A  . Years of Education: N/A   Occupational History  . Not on file.   Social History Main Topics  . Smoking status: Never Smoker   . Smokeless tobacco: Never Used  . Alcohol Use: No  . Drug Use: No  . Sexual Activity: Not on file   Other Topics Concern  . Not on file   Social History Narrative  . No narrative on file    REVIEW OF SYSTEMS: Constitutional: No fevers, chills, or sweats, no generalized fatigue, change in appetite Eyes: No visual changes, double vision, eye pain Ear, nose and throat: No hearing loss, ear pain, nasal congestion, sore throat Cardiovascular: No chest pain, palpitations Respiratory:  No shortness of breath at rest or with exertion, wheezes GastrointestinaI: No nausea, vomiting, diarrhea, abdominal pain, fecal incontinence Genitourinary:  No dysuria, urinary retention or frequency Musculoskeletal:  No neck pain, back pain Integumentary: No rash, pruritus, skin lesions Neurological: as above Psychiatric: No depression, insomnia, anxiety Endocrine: No palpitations, fatigue, diaphoresis, mood swings, change in appetite, change in weight, increased  thirst Hematologic/Lymphatic:  No anemia, purpura, petechiae. Allergic/Immunologic: no itchy/runny eyes, nasal congestion, recent allergic reactions, rashes  PHYSICAL EXAM: Filed Vitals:   10/04/13 1122  BP: 130/70  Pulse: 70  Temp: 97.5 F (36.4 C)  Resp: 16   General: No acute distress Head:  Normocephalic/atraumatic, tenderness to palpation over bilateral temporal regions, no occipital tenderness today, no tenderness over the vertex Neck: supple, no paraspinal tenderness, full range of motion Heart:  Regular rate and rhythm Lungs:  Clear to auscultation bilaterally Back: No paraspinal tenderness Skin/Extremities: No rash, no edema Neurological Exam: alert and oriented to person, place, and time. No aphasia or dysarthria. Fund of knowledge is appropriate.  Recent and remote memory are intact.  Attention and concentration are normal.    Able to name objects and repeat phrases. Cranial nerves: Pupils equal, round, reactive to light.  Fundoscopic exam unremarkable, no papilledema. Extraocular movements intact with no nystagmus. Visual fields full. Facial sensation intact. No facial asymmetry. Tongue, uvula, palate midline.  Motor: Bulk and tone normal, muscle strength 5/5 throughout with no pronator drift.  Sensation decreased vibration to both ankles.  No extinction to double simultaneous stimulation.  Deep tendon reflexes brisk 2+ throughout, toes downgoing.  Finger to nose testing intact.  Gait narrow-based and steady, able to tandem walk adequately.  Romberg negative.  IMPRESSION: This is a pleasant 80 yo RH woman with a history of hyperlipidemia, TIAs that started in 2006 on Aggrenox, with new onset headaches since June 2015 that are triggered by exposure to sun.  On her last visit, there was some tenderness to palpation over the right occipital region, suggestive of occipital neuralgia. However today tenderness is over the bilateral temporal regions. Check ESR to assess for temporal  arteritis. Her MRI brain is unremarkable.  We again discussed symptomatic treatment with gabapentin, side effects were discussed, she will start on low dose with uptitration as tolerated. The episodes described as TIA where she has brief lapses in memory with last episode 2 months ago raises the question of partial seizures. Routine EEG will be ordered. We discussed  driving laws, she knows to stop driving for any episode of loss of awareness until 6 months event-free. She will follow-up in 2 months.  Thank you for allowing me to participate in her care.  Please do not hesitate to call for any questions or concerns.  The duration of this appointment visit was 25 minutes of face-to-face time with the patient.  Greater than 50% of this time was spent in counseling, explanation of diagnosis, planning of further management, and coordination of care.   Ellouise Newer, M.D.   CC: Dr. Woody Seller

## 2013-10-06 NOTE — Progress Notes (Signed)
Faxed

## 2013-10-14 ENCOUNTER — Telehealth: Payer: Self-pay | Admitting: *Deleted

## 2013-10-14 NOTE — Telephone Encounter (Signed)
Patient not tolerating her medication (gabapentin) once she increased to two daily. Advised patient to continue one at bed time until further notice. Please advise

## 2013-10-17 NOTE — Telephone Encounter (Signed)
Stay on 1 capsule at bedtime for another 2 weeks, then re-try increasing again. Thanks

## 2013-10-17 NOTE — Telephone Encounter (Signed)
She states that since increasing the Gabapentin to 2 a day states that she started feeling "drunk", she was afraid of falling. She went back to taking 1 a day on Sat. Pls advise.

## 2013-10-17 NOTE — Telephone Encounter (Signed)
Patient was notified of advisement via voicemail per her request.

## 2013-10-17 NOTE — Telephone Encounter (Signed)
Lmom to return my call. 

## 2013-10-26 ENCOUNTER — Ambulatory Visit (INDEPENDENT_AMBULATORY_CARE_PROVIDER_SITE_OTHER): Payer: Medicare Other | Admitting: Neurology

## 2013-10-26 DIAGNOSIS — R4182 Altered mental status, unspecified: Secondary | ICD-10-CM

## 2013-10-26 DIAGNOSIS — R413 Other amnesia: Secondary | ICD-10-CM

## 2013-10-26 DIAGNOSIS — R51 Headache: Secondary | ICD-10-CM

## 2013-10-27 NOTE — Procedures (Signed)
ELECTROENCEPHALOGRAM REPORT  Date of Study: 10/26/2013  Patient's Name: Dana [name] MRN: [id number] Date of Birth: 06-23-1927  Referring Provider: Dr. Ellouise Newer  Clinical History: This is an 80 year old woman with episodes of brief lapses in memory.  Medications: Aggrenox, Zocor, protonix  Technical Summary: A multichannel digital EEG recording measured by the international 10-20 system with electrodes applied with paste and impedances below 5000 ohms performed in our laboratory with EKG monitoring in an awake and drowsy patient.  Hyperventilation and photic stimulation were performed.  The digital EEG was referentially recorded, reformatted, and digitally filtered in a variety of bipolar and referential montages for optimal display.    Description: The patient is awake and drowsy during the recording.  During maximal wakefulness, there is a symmetric, medium voltage 11 Hz posterior dominant rhythm that attenuates with eye opening.  The record is symmetric.  During drowsiness, there is an increase in theta slowing of the background. Hyperventilation and photic stimulation did not elicit any abnormalities.  There were no epileptiform discharges or electrographic seizures seen.    EKG lead was unremarkable.  Impression: This awake and drowsy EEG is normal.    Clinical Correlation: A normal EEG does not exclude a clinical diagnosis of epilepsy.  If further clinical questions remain, prolonged EEG may be helpful.  Clinical correlation is advised.   Ellouise Newer, M.D.

## 2013-12-01 ENCOUNTER — Telehealth: Payer: Self-pay | Admitting: *Deleted

## 2013-12-01 NOTE — Telephone Encounter (Signed)
Patient called to cancel follow up appointment for 01-03-14 she had death in her family she will call later to reschedule

## 2013-12-01 NOTE — Telephone Encounter (Signed)
Noted  

## 2013-12-02 ENCOUNTER — Telehealth: Payer: Self-pay | Admitting: *Deleted

## 2013-12-02 NOTE — Telephone Encounter (Signed)
Patient cancelled follow up appointment for 12-05-2013. Patient will call to reschedule

## 2013-12-02 NOTE — Telephone Encounter (Signed)
Noted  

## 2013-12-05 ENCOUNTER — Ambulatory Visit: Payer: Medicare Other | Admitting: Neurology

## 2014-03-13 ENCOUNTER — Encounter: Payer: Self-pay | Admitting: Cardiovascular Disease

## 2014-03-23 ENCOUNTER — Encounter: Payer: Self-pay | Admitting: *Deleted

## 2014-03-23 ENCOUNTER — Ambulatory Visit (INDEPENDENT_AMBULATORY_CARE_PROVIDER_SITE_OTHER): Payer: Medicare Other | Admitting: Cardiovascular Disease

## 2014-03-23 ENCOUNTER — Encounter: Payer: Self-pay | Admitting: Cardiovascular Disease

## 2014-03-23 VITALS — BP 153/64 | HR 57 | Ht 63.0 in | Wt 139.0 lb

## 2014-03-23 DIAGNOSIS — R079 Chest pain, unspecified: Secondary | ICD-10-CM

## 2014-03-23 DIAGNOSIS — E785 Hyperlipidemia, unspecified: Secondary | ICD-10-CM

## 2014-03-23 DIAGNOSIS — I25118 Atherosclerotic heart disease of native coronary artery with other forms of angina pectoris: Secondary | ICD-10-CM

## 2014-03-23 DIAGNOSIS — I10 Essential (primary) hypertension: Secondary | ICD-10-CM

## 2014-03-23 MED ORDER — AMLODIPINE BESYLATE 5 MG PO TABS: 5.0000 mg | ORAL_TABLET | Freq: Every day | ORAL | Status: DC

## 2014-03-23 NOTE — Progress Notes (Signed)
Patient ID: EMELI GOGUEN, female   DOB: [birth year], 81 y.o.   MRN: [id number]      SUBJECTIVE: The patient is an 81 year old woman with a history of nonobstructive coronary artery disease, hypertension, hyperlipidemia, and chronic kidney disease. She underwent cardiac catheterization on 11/19/2011 which demonstrated a mid LAD 30% stenosis and myocardial bridging in the mid LAD as well. There were no significant stenoses. As per primary care physician's office notes, she has been experiencing atypical chest pain and has also had uncontrolled blood pressure. On the morning of 03/12/2014, she was awoken by severe retrosternal chest pain which radiated to her elbows bilaterally. She had associated nausea but denies vomiting. She took an aspirin and then she began to walk to alleviate the pain and then subsequently rested. Since then she has felt somewhat fatigued. She denies orthopnea, leg swelling, palpitations, and paroxysmal nocturnal dyspnea, as well as syncope. She said that prior to this when she takes out the trash or does anything requiring significant exertion such as yardwork, she has severe retrosternal chest pain. An ECG performed on 03/13/2014 demonstrated normal sinus rhythm with no significant ischemic ST segment or T-wave abnormalities.  Review of Systems: As per "subjective", otherwise negative.  Allergies  Allergen Reactions  . Gabapentin Other (See Comments)     Per patient "it made me drunk as a skunk"  . Paxil [Paroxetine Hcl] Other (See Comments)    Made her feel very tired    Current Outpatient Prescriptions  Medication Sig Dispense Refill  . dipyridamole-aspirin (AGGRENOX) 200-25 MG per 12 hr capsule Take 1 capsule by mouth 2 (two) times daily.    . metoprolol succinate (TOPROL-XL) 50 MG 24 hr tablet Take 50 mg by mouth daily. Take with or immediately following a meal.    . Multiple Vitamins-Minerals (CENTRUM SILVER ADULT 50+ PO) Take 1 tablet by mouth daily.    .  pantoprazole (PROTONIX) 40 MG tablet Take 40 mg by mouth daily.    . ranitidine (ZANTAC) 150 MG tablet Take 150 mg by mouth 2 (two) times daily.    . simvastatin (ZOCOR) 20 MG tablet Take 20 mg by mouth every evening.     No current facility-administered medications for this visit.    Past Medical History  Diagnosis Date  . Unspecified transient cerebral ischemia   . Pure hypercholesterolemia   . Type II or unspecified type diabetes mellitus without mention of complication, not stated as uncontrolled   . Encounter for long-term (current) use of other medications   . Unspecified hypertensive kidney disease with chronic kidney disease stage I through stage IV, or unspecified   . Chronic kidney disease, stage III (moderate)   . Other and unspecified hyperlipidemia   . Peripheral vascular disease, unspecified   . Headache(784.0)   . Anxiety state, unspecified   . Transient ischemic attack (TIA), and cerebral infarction without residual deficits(V12.54)   . HTN (hypertension)     Past Surgical History  Procedure Laterality Date  . Cholecystectomy    . Cataract surgery      History   Social History  . Marital Status: Widowed    Spouse Name: N/A    Number of Children: N/A  . Years of Education: N/A   Occupational History  . Not on file.   Social History Main Topics  . Smoking status: Never Smoker   . Smokeless tobacco: Never Used  . Alcohol Use: No  . Drug Use: No  . Sexual Activity: Not on file  Other Topics Concern  . Not on file   Social History Narrative     Filed Vitals:   03/23/14 0851  BP: 153/64  Pulse: 57  Height: 5\' 3"  (1.6 m)  Weight: 139 lb (63.05 kg)  SpO2: 96%    PHYSICAL EXAM General: NAD HEENT: Normal. Neck: No JVD, no thyromegaly. Lungs: Clear to auscultation bilaterally with normal respiratory effort. CV: Nondisplaced PMI.  Regular rate and rhythm, normal S1/S2, no S3/S4, no murmur. No pretibial or periankle edema.  No carotid bruit.   Normal pedal pulses.  Abdomen: Soft, nontender, no hepatosplenomegaly, no distention.  Neurologic: Alert and oriented x 3.  Psych: Normal affect. Skin: Normal. Musculoskeletal: Normal range of motion, no gross deformities. Extremities: No clubbing or cyanosis.   ECG: Most recent ECG reviewed.      ASSESSMENT AND PLAN: 1. Chest pain with nonobstructive CAD and intramyocardial bridging: ECG is normal with no significant Q waves to suggest she had an MI. However, she has remained fatigue since the episode on 1/17. I will obtain an echocardiogram to assess LV systolic function and regional wall motion. It is possible that LAD bridging could be causing some of her symptoms. I will start amlodipine 5 mg to see if this helps to alleviate her symptoms, which will also help to reduce BP. 2. Essential HTN: Elevated at PCP's office and today, with complaints of chest pain. I will start amlodipine 5 mg to see if this helps to alleviate her symptoms, which will also help to reduce BP. 3. Hyperlipidemia: Will obtain copy of lipids from PCP's office. Continue simvastatin 20 mg.  Dispo: f/u 3-4 weeks.  Kate Sable, M.D., F.A.C.C.

## 2014-03-23 NOTE — Patient Instructions (Signed)
   Begin Norvasc 5mg  daily - new sent to pharmacy. Continue all other medications.   Your physician has requested that you have an echocardiogram. Echocardiography is a painless test that uses sound waves to create images of your heart. It provides your doctor with information about the size and shape of your heart and how well your heart's chambers and valves are working. This procedure takes approximately one hour. There are no restrictions for this procedure. Office will contact with results via phone or letter.   Follow up in  3-4 weeks

## 2014-03-29 ENCOUNTER — Other Ambulatory Visit: Payer: Self-pay

## 2014-03-29 ENCOUNTER — Other Ambulatory Visit (INDEPENDENT_AMBULATORY_CARE_PROVIDER_SITE_OTHER): Payer: Medicare Other

## 2014-03-29 DIAGNOSIS — I25118 Atherosclerotic heart disease of native coronary artery with other forms of angina pectoris: Secondary | ICD-10-CM

## 2014-03-29 DIAGNOSIS — R079 Chest pain, unspecified: Secondary | ICD-10-CM

## 2014-03-30 ENCOUNTER — Telehealth: Payer: Self-pay | Admitting: *Deleted

## 2014-03-30 NOTE — Telephone Encounter (Signed)
Notes Recorded by Laurine Blazer, LPN on 06/27/9670 at 8:97 PM Patient notified. Follow up scheduled for 04/06/14 with Dr. Bronson Ing.

## 2014-03-30 NOTE — Telephone Encounter (Signed)
-----   Message from Herminio Commons, MD sent at 03/30/2014  9:30 AM EST ----- Vigorous pumping function. Good results overall.

## 2014-04-06 ENCOUNTER — Encounter: Payer: Self-pay | Admitting: Cardiovascular Disease

## 2014-04-06 ENCOUNTER — Ambulatory Visit (INDEPENDENT_AMBULATORY_CARE_PROVIDER_SITE_OTHER): Payer: Medicare Other | Admitting: Cardiovascular Disease

## 2014-04-06 VITALS — BP 134/76 | HR 86 | Ht 63.0 in | Wt 138.0 lb

## 2014-04-06 DIAGNOSIS — I25118 Atherosclerotic heart disease of native coronary artery with other forms of angina pectoris: Secondary | ICD-10-CM

## 2014-04-06 DIAGNOSIS — I10 Essential (primary) hypertension: Secondary | ICD-10-CM

## 2014-04-06 DIAGNOSIS — R0609 Other forms of dyspnea: Secondary | ICD-10-CM

## 2014-04-06 DIAGNOSIS — E785 Hyperlipidemia, unspecified: Secondary | ICD-10-CM

## 2014-04-06 DIAGNOSIS — R079 Chest pain, unspecified: Secondary | ICD-10-CM

## 2014-04-06 DIAGNOSIS — K219 Gastro-esophageal reflux disease without esophagitis: Secondary | ICD-10-CM

## 2014-04-06 NOTE — Patient Instructions (Signed)
Your physician recommends that you schedule a follow-up appointment in: 3-4 months with Dr. Bronson Ing  Your physician recommends that you continue on your current medications as directed. Please refer to the Current Medication list given to you today.  Thank you for choosing [facility]!!

## 2014-04-06 NOTE — Progress Notes (Signed)
Patient ID: Dana [name], female   DOB: Oct 11, 1927, 81 y.o.   MRN: [id number]      SUBJECTIVE: The patient returns for follow-up. At her last visit I ordered an echocardiogram and began amlodipine 5 mg daily. Echocardiogram demonstrated vigorous left ventricular systolic function, LVEF 23-76%, with grade 1 diastolic dysfunction. She has no longer had any more episodes of exertional chest pain. She has had some exertional dyspnea when taking the trash out. She has also been experiencing indigestion and has taken Zantac for years. She drinks a lot of diet drinks but not water. Again, coronary angiography on 11/19/2011 demonstrated nonobstructive, mid LAD 30% stenosis. The remaining epicardial vessels were patent.   Review of Systems: As per "subjective", otherwise negative.  Allergies  Allergen Reactions  . Gabapentin Other (See Comments)     Per patient "it made me drunk as a skunk"  . Paxil [Paroxetine Hcl] Other (See Comments)    Made her feel very tired    Current Outpatient Prescriptions  Medication Sig Dispense Refill  . amLODipine (NORVASC) 5 MG tablet Take 1 tablet (5 mg total) by mouth daily. 30 tablet 6  . dipyridamole-aspirin (AGGRENOX) 200-25 MG per 12 hr capsule Take 1 capsule by mouth 2 (two) times daily.    . metoprolol succinate (TOPROL-XL) 50 MG 24 hr tablet Take 50 mg by mouth daily. Take with or immediately following a meal.    . Multiple Vitamins-Minerals (CENTRUM SILVER ADULT 50+ PO) Take 1 tablet by mouth daily.    . pantoprazole (PROTONIX) 40 MG tablet Take 40 mg by mouth daily.    . ranitidine (ZANTAC) 150 MG tablet Take 150 mg by mouth 2 (two) times daily.    . simvastatin (ZOCOR) 20 MG tablet Take 20 mg by mouth every evening.     No current facility-administered medications for this visit.    Past Medical History  Diagnosis Date  . Unspecified transient cerebral ischemia   . Pure hypercholesterolemia   . Type II or unspecified type diabetes  mellitus without mention of complication, not stated as uncontrolled   . Encounter for long-term (current) use of other medications   . Unspecified hypertensive kidney disease with chronic kidney disease stage I through stage IV, or unspecified   . Chronic kidney disease, stage III (moderate)   . Other and unspecified hyperlipidemia   . Peripheral vascular disease, unspecified   . Headache(784.0)   . Anxiety state, unspecified   . Transient ischemic attack (TIA), and cerebral infarction without residual deficits(V12.54)   . HTN (hypertension)     Past Surgical History  Procedure Laterality Date  . Cholecystectomy    . Cataract surgery      History   Social History  . Marital Status: Widowed    Spouse Name: N/A  . Number of Children: N/A  . Years of Education: N/A   Occupational History  . Not on file.   Social History Main Topics  . Smoking status: Never Smoker   . Smokeless tobacco: Never Used  . Alcohol Use: No  . Drug Use: No  . Sexual Activity: Not on file   Other Topics Concern  . Not on file   Social History Narrative     Filed Vitals:   04/06/14 1009  Height: 5\' 3"  (1.6 m)   BP 134/76  Pulse 86   PHYSICAL EXAM General: NAD HEENT: Normal. Neck: No JVD, no thyromegaly. Lungs: Clear to auscultation bilaterally with normal respiratory effort. CV: Nondisplaced PMI.  Regular  rate and rhythm, normal S1/S2, no S3/S4, no murmur. No pretibial or periankle edema.  No carotid bruit.  Normal pedal pulses.  Abdomen: Soft, nontender, no hepatosplenomegaly, no distention.  Neurologic: Alert and oriented x 3.  Psych: Normal affect. Skin: Normal. Musculoskeletal: Normal range of motion, no gross deformities. Extremities: No clubbing or cyanosis.   ECG: Most recent ECG reviewed.      ASSESSMENT AND PLAN: 1. Chest pain with nonobstructive CAD and intramyocardial bridging: Improved with addition of amlodipine. It is possible that LAD bridging could have been  causing some of her symptoms, but uncontrolled GERD cannot entirely be ruled out. I advocated increased water intake and less diet drinks. I also offered switching ranitidine to omeprazole, but she would prefer to hold off for now and plan dietary modification. 2. Essential HTN: Now well controlled with the addition of amlodipine 5 mg daily. No changes. 3. Hyperlipidemia: Will obtain copy of lipids from PCP's office. Continue simvastatin 20 mg. 4. GERD: I offered switching ranitidine to omeprazole, but she would prefer to hold off for now and plan dietary modification.  Dispo: f/u 3-4 months.   Kate Sable, M.D., F.A.C.C.

## 2014-04-14 ENCOUNTER — Telehealth: Payer: Self-pay | Admitting: Cardiovascular Disease

## 2014-04-14 NOTE — Telephone Encounter (Signed)
Discussed below with patient.  Confirmed that she is taking her Norvasc, which is the most recent medication that we gave her.  Advised her to check with pharmacy again because they may have her on an automatic refill system.  We did not send any refills in for her today.  Patient verbalized understanding.

## 2014-04-14 NOTE — Telephone Encounter (Signed)
Dana [name] called stating that she received a telephone call from her pharmacy today stating that she has another heart Medication to pick up. She wants to know if she is to be on two different heart medications now.

## 2014-06-08 ENCOUNTER — Encounter: Payer: Self-pay | Admitting: *Deleted

## 2014-06-16 ENCOUNTER — Other Ambulatory Visit: Payer: Self-pay | Admitting: *Deleted

## 2014-06-16 DIAGNOSIS — E7800 Pure hypercholesterolemia, unspecified: Secondary | ICD-10-CM

## 2014-06-22 ENCOUNTER — Telehealth: Payer: Self-pay | Admitting: *Deleted

## 2014-06-22 NOTE — Telephone Encounter (Signed)
-----   Message from Herminio Commons, MD sent at 06/21/2014  1:35 PM EDT ----- Good results.

## 2014-06-22 NOTE — Telephone Encounter (Signed)
Notes Recorded by Laurine Blazer, LPN on 7/41/4239 at 5:32 AM Patient notified.

## 2014-07-11 ENCOUNTER — Encounter: Payer: Self-pay | Admitting: Cardiovascular Disease

## 2014-07-11 ENCOUNTER — Ambulatory Visit (INDEPENDENT_AMBULATORY_CARE_PROVIDER_SITE_OTHER): Payer: Medicare Other | Admitting: Cardiovascular Disease

## 2014-07-11 VITALS — BP 133/74 | HR 54 | Ht 63.0 in | Wt 139.0 lb

## 2014-07-11 DIAGNOSIS — I10 Essential (primary) hypertension: Secondary | ICD-10-CM | POA: Diagnosis not present

## 2014-07-11 DIAGNOSIS — I25118 Atherosclerotic heart disease of native coronary artery with other forms of angina pectoris: Secondary | ICD-10-CM | POA: Diagnosis not present

## 2014-07-11 DIAGNOSIS — E785 Hyperlipidemia, unspecified: Secondary | ICD-10-CM | POA: Diagnosis not present

## 2014-07-11 DIAGNOSIS — K219 Gastro-esophageal reflux disease without esophagitis: Secondary | ICD-10-CM

## 2014-07-11 DIAGNOSIS — R079 Chest pain, unspecified: Secondary | ICD-10-CM

## 2014-07-11 NOTE — Progress Notes (Signed)
Patient ID: Dana [name], female   DOB: 08/03/1927, 81 y.o.   MRN: [id number]      SUBJECTIVE: The patient returns for follow-up of nonobstructive CAD and HTN. Echocardiogram on 03/29/14 demonstrated vigorous left ventricular systolic function, LVEF 70-62%, with grade 1 diastolic dysfunction. Coronary angiography on 11/19/2011 demonstrated nonobstructive, mid LAD 30% stenosis. The remaining epicardial vessels were patent.  She is now feeling very well. She has had 2 fleeting episodes of chest pain since her last visit with me in February. She denies shortness of breath, palpitations, and leg swelling.   Review of Systems: As per "subjective", otherwise negative.  Allergies  Allergen Reactions  . Gabapentin Other (See Comments)     Per patient "it made me drunk as a skunk"  . Paxil [Paroxetine Hcl] Other (See Comments)    Made her feel very tired    Current Outpatient Prescriptions  Medication Sig Dispense Refill  . amLODipine (NORVASC) 5 MG tablet Take 1 tablet (5 mg total) by mouth daily. 30 tablet 6  . dipyridamole-aspirin (AGGRENOX) 200-25 MG per 12 hr capsule Take 1 capsule by mouth 2 (two) times daily.    . metoprolol succinate (TOPROL-XL) 50 MG 24 hr tablet Take 50 mg by mouth daily. Take with or immediately following a meal.    . simvastatin (ZOCOR) 20 MG tablet Take 20 mg by mouth every evening.    . vitamin B-12 (CYANOCOBALAMIN) 1000 MCG tablet Take 1,000 mcg by mouth daily.     No current facility-administered medications for this visit.    Past Medical History  Diagnosis Date  . Unspecified transient cerebral ischemia   . Pure hypercholesterolemia   . Type II or unspecified type diabetes mellitus without mention of complication, not stated as uncontrolled   . Encounter for long-term (current) use of other medications   . Unspecified hypertensive kidney disease with chronic kidney disease stage I through stage IV, or unspecified   . Chronic kidney disease, stage  III (moderate)   . Other and unspecified hyperlipidemia   . Peripheral vascular disease, unspecified   . Headache(784.0)   . Anxiety state, unspecified   . Transient ischemic attack (TIA), and cerebral infarction without residual deficits(V12.54)   . HTN (hypertension)     Past Surgical History  Procedure Laterality Date  . Cholecystectomy    . Cataract surgery      History   Social History  . Marital Status: Widowed    Spouse Name: N/A  . Number of Children: N/A  . Years of Education: N/A   Occupational History  . Not on file.   Social History Main Topics  . Smoking status: Never Smoker   . Smokeless tobacco: Never Used  . Alcohol Use: No  . Drug Use: No  . Sexual Activity: Not on file   Other Topics Concern  . Not on file   Social History Narrative     Filed Vitals:   07/11/14 1035  BP: 133/74  Pulse: 54  Height: 5\' 3"  (1.6 m)  Weight: 139 lb (63.05 kg)    PHYSICAL EXAM General: NAD HEENT: Normal. Neck: No JVD, no thyromegaly. Lungs: Clear to auscultation bilaterally with normal respiratory effort. CV: Nondisplaced PMI.  Regular rate and rhythm, normal S1/S2, no S3/S4, no murmur. No pretibial or periankle edema.  No carotid bruit.  Normal pedal pulses.  Abdomen: Soft, nontender, no hepatosplenomegaly, no distention.  Neurologic: Alert and oriented x 3.  Psych: Normal affect. Skin: Normal. Musculoskeletal: Normal range of motion,  no gross deformities. Extremities: No clubbing or cyanosis.   ECG: Most recent ECG reviewed.      ASSESSMENT AND PLAN: 1. Chest pain with nonobstructive CAD and intramyocardial bridging: Symptomatically stable on amlodipine. It is possible that LAD bridging could have been causing some of her symptoms, but uncontrolled GERD cannot entirely be ruled out. I previously advocated increased water intake and less diet drinks. I also offered switching ranitidine to omeprazole, but she preferred not to.  2. Essential HTN:  Remains well controlled with the addition of amlodipine 5 mg daily. No changes.  3. Hyperlipidemia: Will obtain copy of lipids from PCP's office. Continue simvastatin 20 mg.  4. GERD: Continue Zantac.  Dispo: f/u 6 months.   Kate Sable, M.D., F.A.C.C.

## 2014-07-11 NOTE — Patient Instructions (Signed)
Your physician recommends that you continue on your current medications as directed. Please refer to the Current Medication list given to you today. Your physician recommends that you schedule a follow-up appointment in: 6 months. You will receive a reminder letter in the mail in about 4 months reminding you to call and schedule your appointment. If you don't receive this letter, please contact our office. 

## 2014-10-09 ENCOUNTER — Other Ambulatory Visit: Payer: Self-pay | Admitting: Cardiovascular Disease

## 2015-01-24 ENCOUNTER — Ambulatory Visit: Payer: Medicare Other | Admitting: Cardiovascular Disease

## 2015-05-14 ENCOUNTER — Other Ambulatory Visit: Payer: Self-pay | Admitting: Cardiovascular Disease

## 2015-06-01 ENCOUNTER — Ambulatory Visit (INDEPENDENT_AMBULATORY_CARE_PROVIDER_SITE_OTHER): Payer: Medicare Other | Admitting: Cardiovascular Disease

## 2015-06-01 ENCOUNTER — Encounter: Payer: Self-pay | Admitting: Cardiovascular Disease

## 2015-06-01 VITALS — BP 108/70 | HR 56 | Ht 64.0 in | Wt 141.0 lb

## 2015-06-01 DIAGNOSIS — I25118 Atherosclerotic heart disease of native coronary artery with other forms of angina pectoris: Secondary | ICD-10-CM | POA: Diagnosis not present

## 2015-06-01 DIAGNOSIS — R079 Chest pain, unspecified: Secondary | ICD-10-CM | POA: Diagnosis not present

## 2015-06-01 DIAGNOSIS — I10 Essential (primary) hypertension: Secondary | ICD-10-CM | POA: Diagnosis not present

## 2015-06-01 DIAGNOSIS — E785 Hyperlipidemia, unspecified: Secondary | ICD-10-CM

## 2015-06-01 DIAGNOSIS — K219 Gastro-esophageal reflux disease without esophagitis: Secondary | ICD-10-CM

## 2015-06-01 MED ORDER — AMLODIPINE BESYLATE 5 MG PO TABS: 5.0000 mg | ORAL_TABLET | Freq: Every day | ORAL | Status: DC

## 2015-06-01 NOTE — Addendum Note (Signed)
Addended by: Julian Hy T on: 06/01/2015 10:09 AM   Modules accepted: Orders

## 2015-06-01 NOTE — Progress Notes (Signed)
Patient ID: Dana [name], female   DOB: 09-07-1927, 82 y.o.   MRN: [id number]      SUBJECTIVE: The patient returns for follow-up of nonobstructive CAD and HTN. Echocardiogram on 03/29/14 demonstrated vigorous left ventricular systolic function, LVEF [id number], with grade 1 diastolic dysfunction. Coronary angiography on 11/19/2011 demonstrated nonobstructive, mid LAD 30% stenosis. The remaining epicardial vessels were patent.  She very seldom has chest pains and denies shortness of breath.   ECG today shows sinus bradycardia with PAC's.   Review of Systems: As per "subjective", otherwise negative.  Allergies  Allergen Reactions  . Gabapentin Other (See Comments)     Per patient "it made me drunk as a skunk"  . Paxil [Paroxetine Hcl] Other (See Comments)    Made her feel very tired    Current Outpatient Prescriptions  Medication Sig Dispense Refill  . amLODipine (NORVASC) 5 MG tablet TAKE ONE TABLET BY MOUTH ONCE DAILY. 30 tablet 0  . dipyridamole-aspirin (AGGRENOX) 200-25 MG per 12 hr capsule Take 1 capsule by mouth 2 (two) times daily.    . metoprolol succinate (TOPROL-XL) 50 MG 24 hr tablet Take 50 mg by mouth daily. Take with or immediately following a meal.    . omeprazole (PRILOSEC) 40 MG capsule Take 40 mg by mouth daily.    . simvastatin (ZOCOR) 20 MG tablet Take 20 mg by mouth every evening.    . vitamin B-12 (CYANOCOBALAMIN) 1000 MCG tablet Take 1,000 mcg by mouth daily.     No current facility-administered medications for this visit.    Past Medical History  Diagnosis Date  . Unspecified transient cerebral ischemia   . Pure hypercholesterolemia   . Type II or unspecified type diabetes mellitus without mention of complication, not stated as uncontrolled   . Encounter for long-term (current) use of other medications   . Unspecified hypertensive kidney disease with chronic kidney disease stage I through stage IV, or unspecified   . Chronic kidney disease, stage III  (moderate)   . Other and unspecified hyperlipidemia   . Peripheral vascular disease, unspecified ([place])   . Headache(784.0)   . Anxiety state, unspecified   . Transient ischemic attack (TIA), and cerebral infarction without residual deficits   . HTN (hypertension)     Past Surgical History  Procedure Laterality Date  . Cholecystectomy    . Cataract surgery      Social History   Social History  . Marital Status: Widowed    Spouse Name: N/A  . Number of Children: N/A  . Years of Education: N/A   Occupational History  . Not on file.   Social History Main Topics  . Smoking status: Never Smoker   . Smokeless tobacco: Never Used  . Alcohol Use: No  . Drug Use: No  . Sexual Activity: Not on file   Other Topics Concern  . Not on file   Social History Narrative     Filed Vitals:   06/01/15 0951  BP: 108/70  Pulse: 56  Height: 5\' 4"  (1.626 m)  Weight: 141 lb (63.957 kg)  SpO2: 97%    PHYSICAL EXAM General: NAD HEENT: Normal. Neck: No JVD, no thyromegaly. Lungs: Clear to auscultation bilaterally with normal respiratory effort. CV: Nondisplaced PMI.  Bradycardic, regular rhythm, normal S1/S2, no S3/S4, no murmur. No pretibial or periankle edema.  No carotid bruit.   Abdomen: Soft, nontender, no distention.  Neurologic: Alert and oriented.  Psych: Normal affect. Skin: Normal. Musculoskeletal: No gross deformities.  ECG: Most  recent ECG reviewed.      ASSESSMENT AND PLAN: 1. Chest pain with nonobstructive CAD and intramyocardial bridging: Symptomatically stable on amlodipine. It is possible that LAD bridging could have been causing some of her symptoms, but uncontrolled GERD was also possible.  2. Essential HTN: Remains well controlled. No changes.  3. Hyperlipidemia: Continue simvastatin 20 mg.  4. GERD: Continue omeprazole.  Dispo: f/u prn.  Kate Sable, M.D., F.A.C.C.

## 2015-06-01 NOTE — Patient Instructions (Signed)
Your physician recommends that you schedule a follow-up appointment AS NEEDED WITH DR. KONESWARAN  Your physician recommends that you continue on your current medications as directed. Please refer to the Current Medication list given to you today.  Thank you for choosing [facility]!!   

## 2019-03-24 DIAGNOSIS — E7800 Pure hypercholesterolemia, unspecified: Secondary | ICD-10-CM | POA: Diagnosis not present

## 2019-03-24 DIAGNOSIS — I10 Essential (primary) hypertension: Secondary | ICD-10-CM | POA: Diagnosis not present

## 2019-04-21 DIAGNOSIS — I10 Essential (primary) hypertension: Secondary | ICD-10-CM | POA: Diagnosis not present

## 2019-04-21 DIAGNOSIS — E7800 Pure hypercholesterolemia, unspecified: Secondary | ICD-10-CM | POA: Diagnosis not present

## 2019-05-17 DIAGNOSIS — E7800 Pure hypercholesterolemia, unspecified: Secondary | ICD-10-CM | POA: Diagnosis not present

## 2019-05-17 DIAGNOSIS — I10 Essential (primary) hypertension: Secondary | ICD-10-CM | POA: Diagnosis not present

## 2019-06-02 DIAGNOSIS — H401123 Primary open-angle glaucoma, left eye, severe stage: Secondary | ICD-10-CM | POA: Diagnosis not present

## 2019-06-06 DIAGNOSIS — E7800 Pure hypercholesterolemia, unspecified: Secondary | ICD-10-CM | POA: Diagnosis not present

## 2019-06-06 DIAGNOSIS — I10 Essential (primary) hypertension: Secondary | ICD-10-CM | POA: Diagnosis not present

## 2019-06-13 DIAGNOSIS — I10 Essential (primary) hypertension: Secondary | ICD-10-CM | POA: Diagnosis not present

## 2019-06-13 DIAGNOSIS — R2689 Other abnormalities of gait and mobility: Secondary | ICD-10-CM | POA: Diagnosis not present

## 2019-06-13 DIAGNOSIS — E7800 Pure hypercholesterolemia, unspecified: Secondary | ICD-10-CM | POA: Diagnosis not present

## 2019-06-13 DIAGNOSIS — Z0000 Encounter for general adult medical examination without abnormal findings: Secondary | ICD-10-CM | POA: Diagnosis not present

## 2019-06-13 DIAGNOSIS — Z1211 Encounter for screening for malignant neoplasm of colon: Secondary | ICD-10-CM | POA: Diagnosis not present

## 2019-06-13 DIAGNOSIS — Z79899 Other long term (current) drug therapy: Secondary | ICD-10-CM | POA: Diagnosis not present

## 2019-06-13 DIAGNOSIS — Z299 Encounter for prophylactic measures, unspecified: Secondary | ICD-10-CM | POA: Diagnosis not present

## 2019-06-13 DIAGNOSIS — D649 Anemia, unspecified: Secondary | ICD-10-CM | POA: Diagnosis not present

## 2019-06-13 DIAGNOSIS — Z7189 Other specified counseling: Secondary | ICD-10-CM | POA: Diagnosis not present

## 2019-06-13 DIAGNOSIS — Z6822 Body mass index (BMI) 22.0-22.9, adult: Secondary | ICD-10-CM | POA: Diagnosis not present

## 2019-07-04 DIAGNOSIS — R109 Unspecified abdominal pain: Secondary | ICD-10-CM | POA: Diagnosis not present

## 2019-07-04 DIAGNOSIS — I10 Essential (primary) hypertension: Secondary | ICD-10-CM | POA: Diagnosis not present

## 2019-07-04 DIAGNOSIS — K5792 Diverticulitis of intestine, part unspecified, without perforation or abscess without bleeding: Secondary | ICD-10-CM | POA: Diagnosis not present

## 2019-07-04 DIAGNOSIS — Z299 Encounter for prophylactic measures, unspecified: Secondary | ICD-10-CM | POA: Diagnosis not present

## 2019-07-04 DIAGNOSIS — E7800 Pure hypercholesterolemia, unspecified: Secondary | ICD-10-CM | POA: Diagnosis not present

## 2019-07-24 DIAGNOSIS — I10 Essential (primary) hypertension: Secondary | ICD-10-CM | POA: Diagnosis not present

## 2019-07-24 DIAGNOSIS — E7800 Pure hypercholesterolemia, unspecified: Secondary | ICD-10-CM | POA: Diagnosis not present

## 2019-08-23 DIAGNOSIS — I10 Essential (primary) hypertension: Secondary | ICD-10-CM | POA: Diagnosis not present

## 2019-08-24 DIAGNOSIS — I10 Essential (primary) hypertension: Secondary | ICD-10-CM | POA: Diagnosis not present

## 2019-08-24 DIAGNOSIS — E7800 Pure hypercholesterolemia, unspecified: Secondary | ICD-10-CM | POA: Diagnosis not present

## 2019-09-12 DIAGNOSIS — Z299 Encounter for prophylactic measures, unspecified: Secondary | ICD-10-CM | POA: Diagnosis not present

## 2019-09-12 DIAGNOSIS — I10 Essential (primary) hypertension: Secondary | ICD-10-CM | POA: Diagnosis not present

## 2019-09-23 DIAGNOSIS — E7800 Pure hypercholesterolemia, unspecified: Secondary | ICD-10-CM | POA: Diagnosis not present

## 2019-09-23 DIAGNOSIS — I10 Essential (primary) hypertension: Secondary | ICD-10-CM | POA: Diagnosis not present

## 2019-10-22 DIAGNOSIS — M6281 Muscle weakness (generalized): Secondary | ICD-10-CM | POA: Diagnosis not present

## 2019-10-22 DIAGNOSIS — R42 Dizziness and giddiness: Secondary | ICD-10-CM | POA: Diagnosis not present

## 2019-10-22 DIAGNOSIS — Z7982 Long term (current) use of aspirin: Secondary | ICD-10-CM | POA: Diagnosis not present

## 2019-10-22 DIAGNOSIS — G9389 Other specified disorders of brain: Secondary | ICD-10-CM | POA: Diagnosis not present

## 2019-10-22 DIAGNOSIS — R531 Weakness: Secondary | ICD-10-CM | POA: Diagnosis not present

## 2019-10-22 DIAGNOSIS — Z79899 Other long term (current) drug therapy: Secondary | ICD-10-CM | POA: Diagnosis not present

## 2019-10-22 DIAGNOSIS — K219 Gastro-esophageal reflux disease without esophagitis: Secondary | ICD-10-CM | POA: Diagnosis not present

## 2019-10-22 DIAGNOSIS — I10 Essential (primary) hypertension: Secondary | ICD-10-CM | POA: Diagnosis not present

## 2019-10-22 DIAGNOSIS — R4781 Slurred speech: Secondary | ICD-10-CM | POA: Diagnosis not present

## 2019-10-22 DIAGNOSIS — Z8673 Personal history of transient ischemic attack (TIA), and cerebral infarction without residual deficits: Secondary | ICD-10-CM | POA: Diagnosis not present

## 2019-10-22 DIAGNOSIS — H538 Other visual disturbances: Secondary | ICD-10-CM | POA: Diagnosis not present

## 2019-10-22 DIAGNOSIS — G459 Transient cerebral ischemic attack, unspecified: Secondary | ICD-10-CM | POA: Diagnosis not present

## 2019-10-22 DIAGNOSIS — G319 Degenerative disease of nervous system, unspecified: Secondary | ICD-10-CM | POA: Diagnosis not present

## 2019-10-22 DIAGNOSIS — E785 Hyperlipidemia, unspecified: Secondary | ICD-10-CM | POA: Diagnosis not present

## 2019-10-22 DIAGNOSIS — R200 Anesthesia of skin: Secondary | ICD-10-CM | POA: Diagnosis not present

## 2019-10-23 DIAGNOSIS — G459 Transient cerebral ischemic attack, unspecified: Secondary | ICD-10-CM | POA: Diagnosis not present

## 2019-10-23 DIAGNOSIS — I10 Essential (primary) hypertension: Secondary | ICD-10-CM | POA: Diagnosis not present

## 2019-10-24 DIAGNOSIS — I10 Essential (primary) hypertension: Secondary | ICD-10-CM | POA: Diagnosis not present

## 2019-10-24 DIAGNOSIS — G459 Transient cerebral ischemic attack, unspecified: Secondary | ICD-10-CM | POA: Diagnosis not present

## 2019-10-29 DIAGNOSIS — R4781 Slurred speech: Secondary | ICD-10-CM | POA: Diagnosis not present

## 2019-10-29 DIAGNOSIS — I440 Atrioventricular block, first degree: Secondary | ICD-10-CM | POA: Diagnosis not present

## 2019-10-29 DIAGNOSIS — Z8673 Personal history of transient ischemic attack (TIA), and cerebral infarction without residual deficits: Secondary | ICD-10-CM | POA: Diagnosis not present

## 2019-10-29 DIAGNOSIS — G459 Transient cerebral ischemic attack, unspecified: Secondary | ICD-10-CM | POA: Diagnosis not present

## 2019-10-29 DIAGNOSIS — R404 Transient alteration of awareness: Secondary | ICD-10-CM | POA: Diagnosis not present

## 2019-10-29 DIAGNOSIS — Z7982 Long term (current) use of aspirin: Secondary | ICD-10-CM | POA: Diagnosis not present

## 2019-10-29 DIAGNOSIS — Z7902 Long term (current) use of antithrombotics/antiplatelets: Secondary | ICD-10-CM | POA: Diagnosis not present

## 2019-10-29 DIAGNOSIS — Z743 Need for continuous supervision: Secondary | ICD-10-CM | POA: Diagnosis not present

## 2019-10-29 DIAGNOSIS — I10 Essential (primary) hypertension: Secondary | ICD-10-CM | POA: Diagnosis not present

## 2019-10-29 DIAGNOSIS — R29898 Other symptoms and signs involving the musculoskeletal system: Secondary | ICD-10-CM | POA: Diagnosis not present

## 2019-10-29 DIAGNOSIS — R29810 Facial weakness: Secondary | ICD-10-CM | POA: Diagnosis not present

## 2019-10-30 DIAGNOSIS — G459 Transient cerebral ischemic attack, unspecified: Secondary | ICD-10-CM | POA: Diagnosis not present

## 2019-10-31 DIAGNOSIS — G459 Transient cerebral ischemic attack, unspecified: Secondary | ICD-10-CM | POA: Diagnosis not present

## 2019-11-03 DIAGNOSIS — Z299 Encounter for prophylactic measures, unspecified: Secondary | ICD-10-CM | POA: Diagnosis not present

## 2019-11-03 DIAGNOSIS — Z09 Encounter for follow-up examination after completed treatment for conditions other than malignant neoplasm: Secondary | ICD-10-CM | POA: Diagnosis not present

## 2019-11-03 DIAGNOSIS — E7800 Pure hypercholesterolemia, unspecified: Secondary | ICD-10-CM | POA: Diagnosis not present

## 2019-11-03 DIAGNOSIS — I10 Essential (primary) hypertension: Secondary | ICD-10-CM | POA: Diagnosis not present

## 2019-11-03 DIAGNOSIS — G459 Transient cerebral ischemic attack, unspecified: Secondary | ICD-10-CM | POA: Diagnosis not present

## 2019-11-07 DIAGNOSIS — I63232 Cerebral infarction due to unspecified occlusion or stenosis of left carotid arteries: Secondary | ICD-10-CM | POA: Diagnosis not present

## 2019-11-07 DIAGNOSIS — G459 Transient cerebral ischemic attack, unspecified: Secondary | ICD-10-CM | POA: Diagnosis not present

## 2019-11-14 ENCOUNTER — Telehealth: Payer: Self-pay | Admitting: Neurology

## 2019-11-14 ENCOUNTER — Encounter: Payer: Self-pay | Admitting: Neurology

## 2019-11-14 ENCOUNTER — Other Ambulatory Visit: Payer: Self-pay

## 2019-11-14 ENCOUNTER — Ambulatory Visit: Payer: Medicare Other | Admitting: Neurology

## 2019-11-14 VITALS — BP 118/74 | HR 57 | Ht 64.0 in | Wt 136.0 lb

## 2019-11-14 DIAGNOSIS — R202 Paresthesia of skin: Secondary | ICD-10-CM

## 2019-11-14 DIAGNOSIS — G459 Transient cerebral ischemic attack, unspecified: Secondary | ICD-10-CM | POA: Diagnosis not present

## 2019-11-14 DIAGNOSIS — R200 Anesthesia of skin: Secondary | ICD-10-CM

## 2019-11-14 MED ORDER — CLOPIDOGREL BISULFATE 75 MG PO TABS: 75.0000 mg | ORAL_TABLET | Freq: Every day | ORAL | 11 refills | Status: AC

## 2019-11-14 MED ORDER — ASPIRIN EC 81 MG PO TBEC: 81.0000 mg | DELAYED_RELEASE_TABLET | Freq: Every day | ORAL | 11 refills | Status: DC

## 2019-11-14 NOTE — Telephone Encounter (Signed)
[employer] medicare order sent to [facility]. No auth they will reach out to the patient to schedule.  

## 2019-11-14 NOTE — Patient Instructions (Addendum)
I had a long d/w patient and her daughter about recent recurrent TIAs, risk for recurrent stroke/TIAs, personally independently reviewed imaging studies and stroke evaluation results and answered questions.I recommend that she discontinue Aggrenox and instead switch to aspirin 81 mg and Plavix 75 mg daily for 3 weeks followed by Plavix alone for secondary stroke prevention and maintain strict control of hypertension with blood pressure goal below 130/90, diabetes with hemoglobin A1c goal below 6.5% and lipids with LDL cholesterol goal below 70 mg/dL. I also advised the patient to eat a healthy diet with plenty of whole grains, cereals, fruits and vegetables, exercise regularly and maintain ideal body weight.  Check EEG and CT angiogram of the brain and neck to evaluate her carotid stenosis.  Followup in the future with me in 3 months or call earlier if necessary.  Stroke Prevention Some medical conditions and behaviors are associated with a higher chance of having a stroke. You can help prevent a stroke by making nutrition, lifestyle, and other changes, including managing any medical conditions you may have. What nutrition changes can be made?   Eat healthy foods. You can do this by: ? Choosing foods high in fiber, such as fresh fruits and vegetables and whole grains. ? Eating at least 5 or more servings of fruits and vegetables a day. Try to fill half of your plate at each meal with fruits and vegetables. ? Choosing lean protein foods, such as lean cuts of meat, poultry without skin, fish, tofu, beans, and nuts. ? Eating low-fat dairy products. ? Avoiding foods that are high in salt (sodium). This can help lower blood pressure. ? Avoiding foods that have saturated fat, trans fat, and cholesterol. This can help prevent high cholesterol. ? Avoiding processed and premade foods.  Follow your health care provider's specific guidelines for losing weight, controlling high blood pressure (hypertension),  lowering high cholesterol, and managing diabetes. These may include: ? Reducing your daily calorie intake. ? Limiting your daily sodium intake to 1,500 milligrams (mg). ? Using only healthy fats for cooking, such as olive oil, canola oil, or sunflower oil. ? Counting your daily carbohydrate intake. What lifestyle changes can be made?  Maintain a healthy weight. Talk to your health care provider about your ideal weight.  Get at least 30 minutes of moderate physical activity at least 5 days a week. Moderate activity includes brisk walking, biking, and swimming.  Do not use any products that contain nicotine or tobacco, such as cigarettes and e-cigarettes. If you need help quitting, ask your health care provider. It may also be helpful to avoid exposure to secondhand smoke.  Limit alcohol intake to no more than 1 drink a day for nonpregnant women and 2 drinks a day for men. One drink equals 12 oz of beer, 5 oz of wine, or 1 oz of hard liquor.  Stop any illegal drug use.  Avoid taking birth control pills. Talk to your health care provider about the risks of taking birth control pills if: ? You are over 40 years old. ? You smoke. ? You get migraines. ? You have ever had a blood clot. What other changes can be made?  Manage your cholesterol levels. ? Eating a healthy diet is important for preventing high cholesterol. If cholesterol cannot be managed through diet alone, you may also need to take medicines. ? Take any prescribed medicines to control your cholesterol as told by your health care provider.  Manage your diabetes. ? Eating a healthy diet and exercising  regularly are important parts of managing your blood sugar. If your blood sugar cannot be managed through diet and exercise, you may need to take medicines. ? Take any prescribed medicines to control your diabetes as told by your health care provider.  Control your hypertension. ? To reduce your risk of stroke, try to keep your  blood pressure below 130/80. ? Eating a healthy diet and exercising regularly are an important part of controlling your blood pressure. If your blood pressure cannot be managed through diet and exercise, you may need to take medicines. ? Take any prescribed medicines to control hypertension as told by your health care provider. ? Ask your health care provider if you should monitor your blood pressure at home. ? Have your blood pressure checked every year, even if your blood pressure is normal. Blood pressure increases with age and some medical conditions.  Get evaluated for sleep disorders (sleep apnea). Talk to your health care provider about getting a sleep evaluation if you snore a lot or have excessive sleepiness.  Take over-the-counter and prescription medicines only as told by your health care provider. Aspirin or blood thinners (antiplatelets or anticoagulants) may be recommended to reduce your risk of forming blood clots that can lead to stroke.  Make sure that any other medical conditions you have, such as atrial fibrillation or atherosclerosis, are managed. What are the warning signs of a stroke? The warning signs of a stroke can be easily remembered as BEFAST.  B is for balance. Signs include: ? Dizziness. ? Loss of balance or coordination. ? Sudden trouble walking.  E is for eyes. Signs include: ? A sudden change in vision. ? Trouble seeing.  F is for face. Signs include: ? Sudden weakness or numbness of the face. ? The face or eyelid drooping to one side.  A is for arms. Signs include: ? Sudden weakness or numbness of the arm, usually on one side of the body.  S is for speech. Signs include: ? Trouble speaking (aphasia). ? Trouble understanding.  T is for time. ? These symptoms may represent a serious problem that is an emergency. Do not wait to see if the symptoms will go away. Get medical help right away. Call your local emergency services (911 in the [place] Do not  drive yourself to the hospital.  Other signs of stroke may include: ? A sudden, severe headache with no known cause. ? Nausea or vomiting. ? Seizure. Where to find more information For more information, visit:  American Stroke Association: www.strokeassociation.org  National Stroke Association: www.stroke.org Summary  You can prevent a stroke by eating healthy, exercising, not smoking, limiting alcohol intake, and managing any medical conditions you may have.  Do not use any products that contain nicotine or tobacco, such as cigarettes and e-cigarettes. If you need help quitting, ask your health care provider. It may also be helpful to avoid exposure to secondhand smoke.  Remember BEFAST for warning signs of stroke. Get help right away if you or a loved one has any of these signs. This information is not intended to replace advice given to you by your health care provider. Make sure you discuss any questions you have with your health care provider. Document Revised: 01/23/2017 Document Reviewed: 03/18/2016 Elsevier Patient Education  2020 [employer].

## 2019-11-14 NOTE — Progress Notes (Addendum)
[facility] [address] [place]. [place] [address] [phone]       OFFICE CONSULT NOTE  Dana [name] Date of Birth:  [birth year] Medical Record Number:  [id number]   Referring MD: Jerene Bears Reason for Referral: TIA  HPI: Ms. Encalada is a pleasant 86 year old Caucasian lady seen today for initial office consultation visit for TIA.  She is accompanied by her daughter today.  History is obtained from them and review of referral notes and electronic medical records.  Unfortunately imaging films are not available for personal review today but reports are.  She has past medical history of hypertension, hyperlipidemia who presented to the [facility] on 10/29/2019 with recurrent episodes of transient left upper extremity numbness along with facial droop and confusion.  She had 3 such episodes lasting barely 5 minutes or so.  She denied any actual weakness in the hand but states it was only numb.  The episode resolved at time she called 911 and then occurred again in the ambulance.  By the time she reached the hospital she was completely resolved.  The daughter was eyewitness to 1 of these episode and noticed some confusion and facial droop as well along with left arm numbness.  The patient denied any accompanying headache, blurred vision, vertigo, double vision or lower extremity weakness or numbness.  A week prior interestingly she had had a transient episode of right upper extremity numbness at that time EMS were called and her blood pressure was found to be significantly elevated at 198/98.  She was taken to emergency room and by the time she reached the symptoms resolved.  This time a CT scan of the head was done alone which was negative.  During the more recent admission with recurrent TIA she was admitted and underwent an MRI scan of the brain which apparently showed no acute abnormality though I do not have the films to look at.  She apparently had carotid  ultrasound which as per the daughter showed some moderate 50% stenosis on the left but I do not have the actual report to look at.  We will lipid profile showed LDL cholesterol of 61 and she is on a statin.  She has remote history of TIAs 6 years ago as well and was placed on Aggrenox and done well.  The recent years of brand-name Aggrenox was switched to a generic form.  She has no known prior history of seizures, loss of consciousness, head injury.  ROS:   14 system review of systems is positive for numbness, tingling, confusion, memory loss, facial droop all other systems negative  PMH:  Past Medical History:  Diagnosis Date  . Anxiety state, unspecified   . Chronic kidney disease, stage III (moderate)   . Encounter for long-term (current) use of other medications   . Headache(784.0)   . HTN (hypertension)   . Other and unspecified hyperlipidemia   . Peripheral vascular disease, unspecified ([place])   . Pure hypercholesterolemia   . Transient ischemic attack (TIA), and cerebral infarction without residual deficits(V12.54)   . Type II or unspecified type diabetes mellitus without mention of complication, not stated as uncontrolled   . Unspecified hypertensive kidney disease with chronic kidney disease stage I through stage IV, or unspecified(403.90)   . Unspecified transient cerebral ischemia     Social History:  Social History   Socioeconomic History  . Marital status: Widowed    Spouse name: Not on file  . Number of children: Not on  file  . Years of education: Not on file  . Highest education level: Not on file  Occupational History  . Not on file  Tobacco Use  . Smoking status: Never Smoker  . Smokeless tobacco: Never Used  Substance and Sexual Activity  . Alcohol use: No    Alcohol/week: 0.0 standard drinks  . Drug use: No  . Sexual activity: Not on file  Other Topics Concern  . Not on file  Social History Narrative  . Not on file   Social Determinants of Health    Financial Resource Strain:   . Difficulty of Paying Living Expenses: Not on file  Food Insecurity:   . Worried About Charity fundraiser in the Last Year: Not on file  . Ran Out of Food in the Last Year: Not on file  Transportation Needs:   . Lack of Transportation (Medical): Not on file  . Lack of Transportation (Non-Medical): Not on file  Physical Activity:   . Days of Exercise per Week: Not on file  . Minutes of Exercise per Session: Not on file  Stress:   . Feeling of Stress : Not on file  Social Connections:   . Frequency of Communication with Friends and Family: Not on file  . Frequency of Social Gatherings with Friends and Family: Not on file  . Attends Religious Services: Not on file  . Active Member of Clubs or Organizations: Not on file  . Attends Archivist Meetings: Not on file  . Marital Status: Not on file  Intimate Partner Violence:   . Fear of Current or Ex-Partner: Not on file  . Emotionally Abused: Not on file  . Physically Abused: Not on file  . Sexually Abused: Not on file    Medications:   Current Outpatient Medications on File Prior to Visit  Medication Sig Dispense Refill  . ALPRAZolam (XANAX) 0.25 MG tablet Take 0.25 mg by mouth 2 (two) times daily as needed.    Marland Kitchen omeprazole (PRILOSEC) 40 MG capsule Take 40 mg by mouth daily.    . propranolol (INDERAL) 60 MG tablet Take 60 mg by mouth 3 (three) times daily.    . simvastatin (ZOCOR) 20 MG tablet Take 20 mg by mouth every evening.    . vitamin B-12 (CYANOCOBALAMIN) 1000 MCG tablet Take 1,000 mcg by mouth daily.     No current facility-administered medications on file prior to visit.    Allergies:   Allergies  Allergen Reactions  . Gabapentin Other (See Comments)     Per patient "it made me drunk as a skunk"  . Paxil [Paroxetine Hcl] Other (See Comments)    Made her feel very tired    Physical Exam General: well developed, well nourished pleasant elderly Caucasian lady, seated, in no  evident distress Head: head normocephalic and atraumatic.   Neck: supple with no carotid or supraclavicular bruits Cardiovascular: regular rate and rhythm, no murmurs Musculoskeletal: Mild kyphosis. Skin:  no rash/petichiae Vascular:  Normal pulses all extremities  Neurologic Exam Mental Status: Awake and fully alert. Oriented to place and time. Recent and remote memory intact. Attention span, concentration and fund of knowledge appropriate. Mood and affect appropriate.  Diminished recall 1/3.  Able to name only 5 animals which can walk on 4 legs.  On Mini-Mental status exam she scored 24/30 with deficits in attention, calculation and recall. Cranial Nerves: Fundoscopic exam reveals sharp disc margins. Pupils equal, briskly reactive to light. Extraocular movements full without nystagmus. Visual fields  full to confrontation. Hearing diminished bilaterally. Facial sensation intact. Face, tongue, palate moves normally and symmetrically.  Motor: Normal bulk and tone. Normal strength in all tested extremity muscles. Sensory.: intact to touch , pinprick , position and vibratory sensation.  Coordination: Rapid alternating movements normal in all extremities. Finger-to-nose and heel-to-shin performed accurately bilaterally. Gait and Station: Arises from chair without difficulty. Stance is normal. Gait demonstrates mild slow posture and slight imbalance when standing on a narrow base in either foot unsupported.  Not able to heel, toe and tandem walk  .  Reflexes: 1+ and symmetric. Toes downgoing.   NIHSS  0 Modified Rankin  1   ASSESSMENT: 86 year old pleasant Caucasian lady with recurrent transient episodes of left upper extremity numbness along with some facial droop and confusion possibly right brain subcortical TIAs likely from small vessel disease in September 2021.  Vascular risk factors of hypertension, hyperlipidemia and age.  Prior episode of transient right upper extremity numbness a week ago  possibly a left brain TIA from small vessel disease as well.     PLAN: I had a long d/w patient and her daughter about recent recurrent TIAs, risk for recurrent stroke/TIAs, personally independently reviewed imaging studies and stroke evaluation results and answered questions.I recommend that she discontinue Aggrenox and instead switch to aspirin 81 mg and Plavix 75 mg daily for 3 weeks followed by Plavix alone for secondary stroke prevention and maintain strict control of hypertension with blood pressure goal below 130/90, diabetes with hemoglobin A1c goal below 6.5% and lipids with LDL cholesterol goal below 70 mg/dL. I also advised the patient to eat a healthy diet with plenty of whole grains, cereals, fruits and vegetables, exercise regularly and maintain ideal body weight.  Check EEG and CT angiogram of the brain and neck to evaluate her carotid stenosis.  Greater than 50% time during this 45-minute consultation was it was spent on counseling and coordination of care about TIAs and discussion about stroke prevention and treatment and answering questions.  Followup in the future with me in 3 months or call earlier if necessary. Antony Contras, MD Note: This document was prepared with digital dictation and possible smart phrase technology. Any transcriptional errors that result from this process are unintentional.

## 2019-11-24 DIAGNOSIS — I110 Hypertensive heart disease with heart failure: Secondary | ICD-10-CM | POA: Diagnosis not present

## 2019-11-24 DIAGNOSIS — E7800 Pure hypercholesterolemia, unspecified: Secondary | ICD-10-CM | POA: Diagnosis not present

## 2019-11-24 DIAGNOSIS — I10 Essential (primary) hypertension: Secondary | ICD-10-CM | POA: Diagnosis not present

## 2019-11-30 ENCOUNTER — Other Ambulatory Visit: Payer: Self-pay

## 2019-11-30 ENCOUNTER — Ambulatory Visit: Payer: Medicare Other | Admitting: Neurology

## 2019-11-30 DIAGNOSIS — R410 Disorientation, unspecified: Secondary | ICD-10-CM

## 2019-11-30 DIAGNOSIS — G459 Transient cerebral ischemic attack, unspecified: Secondary | ICD-10-CM

## 2019-11-30 DIAGNOSIS — R200 Anesthesia of skin: Secondary | ICD-10-CM

## 2019-12-04 NOTE — Progress Notes (Signed)
Kindly inform the patient that EEG study was normal

## 2019-12-05 ENCOUNTER — Telehealth: Payer: Self-pay | Admitting: Emergency Medicine

## 2019-12-05 NOTE — Telephone Encounter (Signed)
Called patient's daughter, Hildred Priest (on [place]), discussed Dr. Clydene Fake findings regarding patient's EEG.  Daughter asked if insurance approval has came through for the CT angiogram.  I assured daughter I would check and she would be notified and/or scheduled.  Dorcas expressed appreciation.

## 2019-12-12 DIAGNOSIS — D692 Other nonthrombocytopenic purpura: Secondary | ICD-10-CM | POA: Diagnosis not present

## 2019-12-12 DIAGNOSIS — R413 Other amnesia: Secondary | ICD-10-CM | POA: Diagnosis not present

## 2019-12-12 DIAGNOSIS — Z299 Encounter for prophylactic measures, unspecified: Secondary | ICD-10-CM | POA: Diagnosis not present

## 2019-12-12 DIAGNOSIS — I10 Essential (primary) hypertension: Secondary | ICD-10-CM | POA: Diagnosis not present

## 2019-12-12 DIAGNOSIS — I700 Atherosclerosis of aorta: Secondary | ICD-10-CM | POA: Diagnosis not present

## 2019-12-23 DIAGNOSIS — I110 Hypertensive heart disease with heart failure: Secondary | ICD-10-CM | POA: Diagnosis not present

## 2019-12-23 DIAGNOSIS — E7800 Pure hypercholesterolemia, unspecified: Secondary | ICD-10-CM | POA: Diagnosis not present

## 2019-12-24 DIAGNOSIS — I10 Essential (primary) hypertension: Secondary | ICD-10-CM | POA: Diagnosis not present

## 2019-12-26 ENCOUNTER — Ambulatory Visit
Admission: RE | Admit: 2019-12-26 | Discharge: 2019-12-26 | Disposition: A | Discharge status: Home / Self Care | Payer: Medicare Other | Source: Ambulatory Visit | Attending: Neurology | Admitting: Neurology

## 2019-12-26 DIAGNOSIS — Z7901 Long term (current) use of anticoagulants: Secondary | ICD-10-CM | POA: Diagnosis not present

## 2019-12-26 DIAGNOSIS — R29818 Other symptoms and signs involving the nervous system: Secondary | ICD-10-CM | POA: Diagnosis not present

## 2019-12-26 DIAGNOSIS — W010XXA Fall on same level from slipping, tripping and stumbling without subsequent striking against object, initial encounter: Secondary | ICD-10-CM | POA: Diagnosis not present

## 2019-12-26 DIAGNOSIS — R600 Localized edema: Secondary | ICD-10-CM | POA: Diagnosis not present

## 2019-12-26 DIAGNOSIS — I739 Peripheral vascular disease, unspecified: Secondary | ICD-10-CM | POA: Diagnosis not present

## 2019-12-26 DIAGNOSIS — I480 Paroxysmal atrial fibrillation: Secondary | ICD-10-CM | POA: Diagnosis not present

## 2019-12-26 DIAGNOSIS — I6523 Occlusion and stenosis of bilateral carotid arteries: Secondary | ICD-10-CM | POA: Diagnosis not present

## 2019-12-26 DIAGNOSIS — I2510 Atherosclerotic heart disease of native coronary artery without angina pectoris: Secondary | ICD-10-CM | POA: Diagnosis not present

## 2019-12-26 DIAGNOSIS — R296 Repeated falls: Secondary | ICD-10-CM | POA: Diagnosis not present

## 2019-12-26 DIAGNOSIS — S0990XA Unspecified injury of head, initial encounter: Secondary | ICD-10-CM | POA: Diagnosis not present

## 2019-12-26 DIAGNOSIS — G459 Transient cerebral ischemic attack, unspecified: Secondary | ICD-10-CM

## 2019-12-26 DIAGNOSIS — R6889 Other general symptoms and signs: Secondary | ICD-10-CM | POA: Diagnosis not present

## 2019-12-26 DIAGNOSIS — W19XXXA Unspecified fall, initial encounter: Secondary | ICD-10-CM | POA: Diagnosis not present

## 2019-12-26 DIAGNOSIS — Z79899 Other long term (current) drug therapy: Secondary | ICD-10-CM | POA: Diagnosis not present

## 2019-12-26 DIAGNOSIS — R29810 Facial weakness: Secondary | ICD-10-CM | POA: Diagnosis not present

## 2019-12-26 DIAGNOSIS — Z743 Need for continuous supervision: Secondary | ICD-10-CM | POA: Diagnosis not present

## 2019-12-26 DIAGNOSIS — E785 Hyperlipidemia, unspecified: Secondary | ICD-10-CM | POA: Diagnosis not present

## 2019-12-26 DIAGNOSIS — J811 Chronic pulmonary edema: Secondary | ICD-10-CM | POA: Diagnosis not present

## 2019-12-26 DIAGNOSIS — Z8673 Personal history of transient ischemic attack (TIA), and cerebral infarction without residual deficits: Secondary | ICD-10-CM | POA: Diagnosis not present

## 2019-12-26 DIAGNOSIS — Z888 Allergy status to other drugs, medicaments and biological substances status: Secondary | ICD-10-CM | POA: Diagnosis not present

## 2019-12-26 DIAGNOSIS — S8012XA Contusion of left lower leg, initial encounter: Secondary | ICD-10-CM | POA: Diagnosis not present

## 2019-12-26 DIAGNOSIS — R531 Weakness: Secondary | ICD-10-CM | POA: Diagnosis not present

## 2019-12-26 DIAGNOSIS — I4891 Unspecified atrial fibrillation: Secondary | ICD-10-CM | POA: Diagnosis not present

## 2019-12-26 DIAGNOSIS — R200 Anesthesia of skin: Secondary | ICD-10-CM | POA: Diagnosis not present

## 2019-12-26 DIAGNOSIS — R4781 Slurred speech: Secondary | ICD-10-CM | POA: Diagnosis not present

## 2019-12-26 DIAGNOSIS — S0083XA Contusion of other part of head, initial encounter: Secondary | ICD-10-CM | POA: Diagnosis not present

## 2019-12-26 DIAGNOSIS — K219 Gastro-esophageal reflux disease without esophagitis: Secondary | ICD-10-CM | POA: Diagnosis not present

## 2019-12-26 DIAGNOSIS — S0511XA Contusion of eyeball and orbital tissues, right eye, initial encounter: Secondary | ICD-10-CM | POA: Diagnosis not present

## 2019-12-26 DIAGNOSIS — R404 Transient alteration of awareness: Secondary | ICD-10-CM | POA: Diagnosis not present

## 2019-12-26 DIAGNOSIS — I10 Essential (primary) hypertension: Secondary | ICD-10-CM | POA: Diagnosis not present

## 2019-12-26 MED ORDER — IOPAMIDOL (ISOVUE-370) INJECTION 76%
60.0000 mL | Freq: Once | INTRAVENOUS | Status: AC | PRN
  Administered 2019-12-26: 60 mL via INTRAVENOUS

## 2019-12-27 NOTE — Progress Notes (Signed)
Kindly inform the patient that CT angiogram study does not show any major blockages of the blood vessels in the neck of the brain.  Nothing to worry about

## 2019-12-28 ENCOUNTER — Encounter: Payer: Self-pay | Admitting: *Deleted

## 2019-12-28 ENCOUNTER — Telehealth: Payer: Self-pay | Admitting: Neurology

## 2019-12-28 NOTE — Telephone Encounter (Signed)
Pt.'s daughter Hildred Priest [id number]) is on [place]. She wanted to let Dr. know that mom has been hospitalized since Mon. for 48 hours. She states mom fell & hit head & has had 2-3 mini strokes since being in hospital. She states she is at [facility] is through [facility].

## 2019-12-29 NOTE — Telephone Encounter (Signed)
Thanks for letting me know.  We will see the patient upon follow-up in the clinic after discharge

## 2019-12-31 DIAGNOSIS — R296 Repeated falls: Secondary | ICD-10-CM | POA: Diagnosis not present

## 2019-12-31 DIAGNOSIS — G459 Transient cerebral ischemic attack, unspecified: Secondary | ICD-10-CM | POA: Diagnosis not present

## 2019-12-31 DIAGNOSIS — I480 Paroxysmal atrial fibrillation: Secondary | ICD-10-CM | POA: Diagnosis not present

## 2019-12-31 DIAGNOSIS — I129 Hypertensive chronic kidney disease with stage 1 through stage 4 chronic kidney disease, or unspecified chronic kidney disease: Secondary | ICD-10-CM | POA: Diagnosis not present

## 2019-12-31 DIAGNOSIS — J45909 Unspecified asthma, uncomplicated: Secondary | ICD-10-CM | POA: Diagnosis not present

## 2019-12-31 DIAGNOSIS — E1151 Type 2 diabetes mellitus with diabetic peripheral angiopathy without gangrene: Secondary | ICD-10-CM | POA: Diagnosis not present

## 2019-12-31 DIAGNOSIS — S0511XD Contusion of eyeball and orbital tissues, right eye, subsequent encounter: Secondary | ICD-10-CM | POA: Diagnosis not present

## 2019-12-31 DIAGNOSIS — E1122 Type 2 diabetes mellitus with diabetic chronic kidney disease: Secondary | ICD-10-CM | POA: Diagnosis not present

## 2019-12-31 DIAGNOSIS — S8012XD Contusion of left lower leg, subsequent encounter: Secondary | ICD-10-CM | POA: Diagnosis not present

## 2019-12-31 DIAGNOSIS — R29898 Other symptoms and signs involving the musculoskeletal system: Secondary | ICD-10-CM | POA: Diagnosis not present

## 2019-12-31 DIAGNOSIS — R29810 Facial weakness: Secondary | ICD-10-CM | POA: Diagnosis not present

## 2020-01-03 DIAGNOSIS — S0511XD Contusion of eyeball and orbital tissues, right eye, subsequent encounter: Secondary | ICD-10-CM | POA: Diagnosis not present

## 2020-01-03 DIAGNOSIS — E1122 Type 2 diabetes mellitus with diabetic chronic kidney disease: Secondary | ICD-10-CM | POA: Diagnosis not present

## 2020-01-03 DIAGNOSIS — S8012XD Contusion of left lower leg, subsequent encounter: Secondary | ICD-10-CM | POA: Diagnosis not present

## 2020-01-03 DIAGNOSIS — I129 Hypertensive chronic kidney disease with stage 1 through stage 4 chronic kidney disease, or unspecified chronic kidney disease: Secondary | ICD-10-CM | POA: Diagnosis not present

## 2020-01-03 DIAGNOSIS — I480 Paroxysmal atrial fibrillation: Secondary | ICD-10-CM | POA: Diagnosis not present

## 2020-01-03 DIAGNOSIS — E1151 Type 2 diabetes mellitus with diabetic peripheral angiopathy without gangrene: Secondary | ICD-10-CM | POA: Diagnosis not present

## 2020-01-03 DIAGNOSIS — R29898 Other symptoms and signs involving the musculoskeletal system: Secondary | ICD-10-CM | POA: Diagnosis not present

## 2020-01-03 DIAGNOSIS — R29810 Facial weakness: Secondary | ICD-10-CM | POA: Diagnosis not present

## 2020-01-03 DIAGNOSIS — R296 Repeated falls: Secondary | ICD-10-CM | POA: Diagnosis not present

## 2020-01-03 DIAGNOSIS — J45909 Unspecified asthma, uncomplicated: Secondary | ICD-10-CM | POA: Diagnosis not present

## 2020-01-03 DIAGNOSIS — G459 Transient cerebral ischemic attack, unspecified: Secondary | ICD-10-CM | POA: Diagnosis not present

## 2020-01-04 DIAGNOSIS — E1122 Type 2 diabetes mellitus with diabetic chronic kidney disease: Secondary | ICD-10-CM | POA: Diagnosis not present

## 2020-01-04 DIAGNOSIS — I480 Paroxysmal atrial fibrillation: Secondary | ICD-10-CM | POA: Diagnosis not present

## 2020-01-04 DIAGNOSIS — S0511XD Contusion of eyeball and orbital tissues, right eye, subsequent encounter: Secondary | ICD-10-CM | POA: Diagnosis not present

## 2020-01-04 DIAGNOSIS — G459 Transient cerebral ischemic attack, unspecified: Secondary | ICD-10-CM | POA: Diagnosis not present

## 2020-01-04 DIAGNOSIS — R296 Repeated falls: Secondary | ICD-10-CM | POA: Diagnosis not present

## 2020-01-04 DIAGNOSIS — R29810 Facial weakness: Secondary | ICD-10-CM | POA: Diagnosis not present

## 2020-01-04 DIAGNOSIS — R29898 Other symptoms and signs involving the musculoskeletal system: Secondary | ICD-10-CM | POA: Diagnosis not present

## 2020-01-04 DIAGNOSIS — E1151 Type 2 diabetes mellitus with diabetic peripheral angiopathy without gangrene: Secondary | ICD-10-CM | POA: Diagnosis not present

## 2020-01-04 DIAGNOSIS — S8012XD Contusion of left lower leg, subsequent encounter: Secondary | ICD-10-CM | POA: Diagnosis not present

## 2020-01-04 DIAGNOSIS — J45909 Unspecified asthma, uncomplicated: Secondary | ICD-10-CM | POA: Diagnosis not present

## 2020-01-04 DIAGNOSIS — I129 Hypertensive chronic kidney disease with stage 1 through stage 4 chronic kidney disease, or unspecified chronic kidney disease: Secondary | ICD-10-CM | POA: Diagnosis not present

## 2020-01-05 DIAGNOSIS — Z299 Encounter for prophylactic measures, unspecified: Secondary | ICD-10-CM | POA: Diagnosis not present

## 2020-01-05 DIAGNOSIS — Z09 Encounter for follow-up examination after completed treatment for conditions other than malignant neoplasm: Secondary | ICD-10-CM | POA: Diagnosis not present

## 2020-01-05 DIAGNOSIS — I10 Essential (primary) hypertension: Secondary | ICD-10-CM | POA: Diagnosis not present

## 2020-01-05 DIAGNOSIS — I700 Atherosclerosis of aorta: Secondary | ICD-10-CM | POA: Diagnosis not present

## 2020-01-05 DIAGNOSIS — I4891 Unspecified atrial fibrillation: Secondary | ICD-10-CM | POA: Diagnosis not present

## 2020-01-07 DIAGNOSIS — E1122 Type 2 diabetes mellitus with diabetic chronic kidney disease: Secondary | ICD-10-CM | POA: Diagnosis not present

## 2020-01-07 DIAGNOSIS — G459 Transient cerebral ischemic attack, unspecified: Secondary | ICD-10-CM | POA: Diagnosis not present

## 2020-01-07 DIAGNOSIS — I480 Paroxysmal atrial fibrillation: Secondary | ICD-10-CM | POA: Diagnosis not present

## 2020-01-07 DIAGNOSIS — S0511XD Contusion of eyeball and orbital tissues, right eye, subsequent encounter: Secondary | ICD-10-CM | POA: Diagnosis not present

## 2020-01-07 DIAGNOSIS — I129 Hypertensive chronic kidney disease with stage 1 through stage 4 chronic kidney disease, or unspecified chronic kidney disease: Secondary | ICD-10-CM | POA: Diagnosis not present

## 2020-01-07 DIAGNOSIS — R296 Repeated falls: Secondary | ICD-10-CM | POA: Diagnosis not present

## 2020-01-07 DIAGNOSIS — J45909 Unspecified asthma, uncomplicated: Secondary | ICD-10-CM | POA: Diagnosis not present

## 2020-01-07 DIAGNOSIS — R29898 Other symptoms and signs involving the musculoskeletal system: Secondary | ICD-10-CM | POA: Diagnosis not present

## 2020-01-07 DIAGNOSIS — E1151 Type 2 diabetes mellitus with diabetic peripheral angiopathy without gangrene: Secondary | ICD-10-CM | POA: Diagnosis not present

## 2020-01-07 DIAGNOSIS — R29810 Facial weakness: Secondary | ICD-10-CM | POA: Diagnosis not present

## 2020-01-07 DIAGNOSIS — S8012XD Contusion of left lower leg, subsequent encounter: Secondary | ICD-10-CM | POA: Diagnosis not present

## 2020-01-09 DIAGNOSIS — I6523 Occlusion and stenosis of bilateral carotid arteries: Secondary | ICD-10-CM | POA: Diagnosis not present

## 2020-01-09 DIAGNOSIS — R29898 Other symptoms and signs involving the musculoskeletal system: Secondary | ICD-10-CM | POA: Diagnosis not present

## 2020-01-09 DIAGNOSIS — I480 Paroxysmal atrial fibrillation: Secondary | ICD-10-CM | POA: Diagnosis not present

## 2020-01-09 DIAGNOSIS — G459 Transient cerebral ischemic attack, unspecified: Secondary | ICD-10-CM | POA: Diagnosis not present

## 2020-01-09 DIAGNOSIS — R296 Repeated falls: Secondary | ICD-10-CM | POA: Diagnosis not present

## 2020-01-09 DIAGNOSIS — I10 Essential (primary) hypertension: Secondary | ICD-10-CM | POA: Diagnosis not present

## 2020-01-09 DIAGNOSIS — S0511XD Contusion of eyeball and orbital tissues, right eye, subsequent encounter: Secondary | ICD-10-CM | POA: Diagnosis not present

## 2020-01-09 DIAGNOSIS — E1122 Type 2 diabetes mellitus with diabetic chronic kidney disease: Secondary | ICD-10-CM | POA: Diagnosis not present

## 2020-01-09 DIAGNOSIS — E1151 Type 2 diabetes mellitus with diabetic peripheral angiopathy without gangrene: Secondary | ICD-10-CM | POA: Diagnosis not present

## 2020-01-09 DIAGNOSIS — I129 Hypertensive chronic kidney disease with stage 1 through stage 4 chronic kidney disease, or unspecified chronic kidney disease: Secondary | ICD-10-CM | POA: Diagnosis not present

## 2020-01-09 DIAGNOSIS — I2510 Atherosclerotic heart disease of native coronary artery without angina pectoris: Secondary | ICD-10-CM | POA: Diagnosis not present

## 2020-01-09 DIAGNOSIS — R29810 Facial weakness: Secondary | ICD-10-CM | POA: Diagnosis not present

## 2020-01-09 DIAGNOSIS — S8012XD Contusion of left lower leg, subsequent encounter: Secondary | ICD-10-CM | POA: Diagnosis not present

## 2020-01-09 DIAGNOSIS — J45909 Unspecified asthma, uncomplicated: Secondary | ICD-10-CM | POA: Diagnosis not present

## 2020-01-10 DIAGNOSIS — R296 Repeated falls: Secondary | ICD-10-CM | POA: Diagnosis not present

## 2020-01-10 DIAGNOSIS — R29898 Other symptoms and signs involving the musculoskeletal system: Secondary | ICD-10-CM | POA: Diagnosis not present

## 2020-01-10 DIAGNOSIS — E1122 Type 2 diabetes mellitus with diabetic chronic kidney disease: Secondary | ICD-10-CM | POA: Diagnosis not present

## 2020-01-10 DIAGNOSIS — I480 Paroxysmal atrial fibrillation: Secondary | ICD-10-CM | POA: Diagnosis not present

## 2020-01-10 DIAGNOSIS — J45909 Unspecified asthma, uncomplicated: Secondary | ICD-10-CM | POA: Diagnosis not present

## 2020-01-10 DIAGNOSIS — S0511XD Contusion of eyeball and orbital tissues, right eye, subsequent encounter: Secondary | ICD-10-CM | POA: Diagnosis not present

## 2020-01-10 DIAGNOSIS — S8012XD Contusion of left lower leg, subsequent encounter: Secondary | ICD-10-CM | POA: Diagnosis not present

## 2020-01-10 DIAGNOSIS — G459 Transient cerebral ischemic attack, unspecified: Secondary | ICD-10-CM | POA: Diagnosis not present

## 2020-01-10 DIAGNOSIS — I129 Hypertensive chronic kidney disease with stage 1 through stage 4 chronic kidney disease, or unspecified chronic kidney disease: Secondary | ICD-10-CM | POA: Diagnosis not present

## 2020-01-10 DIAGNOSIS — E1151 Type 2 diabetes mellitus with diabetic peripheral angiopathy without gangrene: Secondary | ICD-10-CM | POA: Diagnosis not present

## 2020-01-10 DIAGNOSIS — R29810 Facial weakness: Secondary | ICD-10-CM | POA: Diagnosis not present

## 2020-01-11 DIAGNOSIS — R296 Repeated falls: Secondary | ICD-10-CM | POA: Diagnosis not present

## 2020-01-11 DIAGNOSIS — I129 Hypertensive chronic kidney disease with stage 1 through stage 4 chronic kidney disease, or unspecified chronic kidney disease: Secondary | ICD-10-CM | POA: Diagnosis not present

## 2020-01-11 DIAGNOSIS — I480 Paroxysmal atrial fibrillation: Secondary | ICD-10-CM | POA: Diagnosis not present

## 2020-01-11 DIAGNOSIS — E1122 Type 2 diabetes mellitus with diabetic chronic kidney disease: Secondary | ICD-10-CM | POA: Diagnosis not present

## 2020-01-11 DIAGNOSIS — S0511XD Contusion of eyeball and orbital tissues, right eye, subsequent encounter: Secondary | ICD-10-CM | POA: Diagnosis not present

## 2020-01-11 DIAGNOSIS — S8012XD Contusion of left lower leg, subsequent encounter: Secondary | ICD-10-CM | POA: Diagnosis not present

## 2020-01-11 DIAGNOSIS — J45909 Unspecified asthma, uncomplicated: Secondary | ICD-10-CM | POA: Diagnosis not present

## 2020-01-11 DIAGNOSIS — E1151 Type 2 diabetes mellitus with diabetic peripheral angiopathy without gangrene: Secondary | ICD-10-CM | POA: Diagnosis not present

## 2020-01-11 DIAGNOSIS — R29898 Other symptoms and signs involving the musculoskeletal system: Secondary | ICD-10-CM | POA: Diagnosis not present

## 2020-01-11 DIAGNOSIS — G459 Transient cerebral ischemic attack, unspecified: Secondary | ICD-10-CM | POA: Diagnosis not present

## 2020-01-11 DIAGNOSIS — R29810 Facial weakness: Secondary | ICD-10-CM | POA: Diagnosis not present

## 2020-01-12 DIAGNOSIS — R29810 Facial weakness: Secondary | ICD-10-CM | POA: Diagnosis not present

## 2020-01-12 DIAGNOSIS — E1122 Type 2 diabetes mellitus with diabetic chronic kidney disease: Secondary | ICD-10-CM | POA: Diagnosis not present

## 2020-01-12 DIAGNOSIS — S0511XD Contusion of eyeball and orbital tissues, right eye, subsequent encounter: Secondary | ICD-10-CM | POA: Diagnosis not present

## 2020-01-12 DIAGNOSIS — R296 Repeated falls: Secondary | ICD-10-CM | POA: Diagnosis not present

## 2020-01-12 DIAGNOSIS — J45909 Unspecified asthma, uncomplicated: Secondary | ICD-10-CM | POA: Diagnosis not present

## 2020-01-12 DIAGNOSIS — E1151 Type 2 diabetes mellitus with diabetic peripheral angiopathy without gangrene: Secondary | ICD-10-CM | POA: Diagnosis not present

## 2020-01-12 DIAGNOSIS — S8012XD Contusion of left lower leg, subsequent encounter: Secondary | ICD-10-CM | POA: Diagnosis not present

## 2020-01-12 DIAGNOSIS — R29898 Other symptoms and signs involving the musculoskeletal system: Secondary | ICD-10-CM | POA: Diagnosis not present

## 2020-01-12 DIAGNOSIS — G459 Transient cerebral ischemic attack, unspecified: Secondary | ICD-10-CM | POA: Diagnosis not present

## 2020-01-12 DIAGNOSIS — I129 Hypertensive chronic kidney disease with stage 1 through stage 4 chronic kidney disease, or unspecified chronic kidney disease: Secondary | ICD-10-CM | POA: Diagnosis not present

## 2020-01-12 DIAGNOSIS — I480 Paroxysmal atrial fibrillation: Secondary | ICD-10-CM | POA: Diagnosis not present

## 2020-01-13 DIAGNOSIS — R29810 Facial weakness: Secondary | ICD-10-CM | POA: Diagnosis not present

## 2020-01-13 DIAGNOSIS — S8012XD Contusion of left lower leg, subsequent encounter: Secondary | ICD-10-CM | POA: Diagnosis not present

## 2020-01-13 DIAGNOSIS — S0511XD Contusion of eyeball and orbital tissues, right eye, subsequent encounter: Secondary | ICD-10-CM | POA: Diagnosis not present

## 2020-01-13 DIAGNOSIS — J45909 Unspecified asthma, uncomplicated: Secondary | ICD-10-CM | POA: Diagnosis not present

## 2020-01-13 DIAGNOSIS — G459 Transient cerebral ischemic attack, unspecified: Secondary | ICD-10-CM | POA: Diagnosis not present

## 2020-01-13 DIAGNOSIS — E1151 Type 2 diabetes mellitus with diabetic peripheral angiopathy without gangrene: Secondary | ICD-10-CM | POA: Diagnosis not present

## 2020-01-13 DIAGNOSIS — R296 Repeated falls: Secondary | ICD-10-CM | POA: Diagnosis not present

## 2020-01-13 DIAGNOSIS — I480 Paroxysmal atrial fibrillation: Secondary | ICD-10-CM | POA: Diagnosis not present

## 2020-01-13 DIAGNOSIS — R29898 Other symptoms and signs involving the musculoskeletal system: Secondary | ICD-10-CM | POA: Diagnosis not present

## 2020-01-13 DIAGNOSIS — E1122 Type 2 diabetes mellitus with diabetic chronic kidney disease: Secondary | ICD-10-CM | POA: Diagnosis not present

## 2020-01-13 DIAGNOSIS — I129 Hypertensive chronic kidney disease with stage 1 through stage 4 chronic kidney disease, or unspecified chronic kidney disease: Secondary | ICD-10-CM | POA: Diagnosis not present

## 2020-01-17 DIAGNOSIS — E1122 Type 2 diabetes mellitus with diabetic chronic kidney disease: Secondary | ICD-10-CM | POA: Diagnosis not present

## 2020-01-17 DIAGNOSIS — I480 Paroxysmal atrial fibrillation: Secondary | ICD-10-CM | POA: Diagnosis not present

## 2020-01-17 DIAGNOSIS — R296 Repeated falls: Secondary | ICD-10-CM | POA: Diagnosis not present

## 2020-01-17 DIAGNOSIS — R29810 Facial weakness: Secondary | ICD-10-CM | POA: Diagnosis not present

## 2020-01-17 DIAGNOSIS — I129 Hypertensive chronic kidney disease with stage 1 through stage 4 chronic kidney disease, or unspecified chronic kidney disease: Secondary | ICD-10-CM | POA: Diagnosis not present

## 2020-01-17 DIAGNOSIS — J45909 Unspecified asthma, uncomplicated: Secondary | ICD-10-CM | POA: Diagnosis not present

## 2020-01-17 DIAGNOSIS — E1151 Type 2 diabetes mellitus with diabetic peripheral angiopathy without gangrene: Secondary | ICD-10-CM | POA: Diagnosis not present

## 2020-01-17 DIAGNOSIS — S8012XD Contusion of left lower leg, subsequent encounter: Secondary | ICD-10-CM | POA: Diagnosis not present

## 2020-01-17 DIAGNOSIS — G459 Transient cerebral ischemic attack, unspecified: Secondary | ICD-10-CM | POA: Diagnosis not present

## 2020-01-17 DIAGNOSIS — R29898 Other symptoms and signs involving the musculoskeletal system: Secondary | ICD-10-CM | POA: Diagnosis not present

## 2020-01-17 DIAGNOSIS — S0511XD Contusion of eyeball and orbital tissues, right eye, subsequent encounter: Secondary | ICD-10-CM | POA: Diagnosis not present

## 2020-01-18 DIAGNOSIS — S8012XD Contusion of left lower leg, subsequent encounter: Secondary | ICD-10-CM | POA: Diagnosis not present

## 2020-01-18 DIAGNOSIS — R296 Repeated falls: Secondary | ICD-10-CM | POA: Diagnosis not present

## 2020-01-18 DIAGNOSIS — I480 Paroxysmal atrial fibrillation: Secondary | ICD-10-CM | POA: Diagnosis not present

## 2020-01-18 DIAGNOSIS — R29810 Facial weakness: Secondary | ICD-10-CM | POA: Diagnosis not present

## 2020-01-18 DIAGNOSIS — E1151 Type 2 diabetes mellitus with diabetic peripheral angiopathy without gangrene: Secondary | ICD-10-CM | POA: Diagnosis not present

## 2020-01-18 DIAGNOSIS — R29898 Other symptoms and signs involving the musculoskeletal system: Secondary | ICD-10-CM | POA: Diagnosis not present

## 2020-01-18 DIAGNOSIS — S0511XD Contusion of eyeball and orbital tissues, right eye, subsequent encounter: Secondary | ICD-10-CM | POA: Diagnosis not present

## 2020-01-18 DIAGNOSIS — E1122 Type 2 diabetes mellitus with diabetic chronic kidney disease: Secondary | ICD-10-CM | POA: Diagnosis not present

## 2020-01-18 DIAGNOSIS — I129 Hypertensive chronic kidney disease with stage 1 through stage 4 chronic kidney disease, or unspecified chronic kidney disease: Secondary | ICD-10-CM | POA: Diagnosis not present

## 2020-01-18 DIAGNOSIS — J45909 Unspecified asthma, uncomplicated: Secondary | ICD-10-CM | POA: Diagnosis not present

## 2020-01-18 DIAGNOSIS — G459 Transient cerebral ischemic attack, unspecified: Secondary | ICD-10-CM | POA: Diagnosis not present

## 2020-01-20 DIAGNOSIS — S0511XD Contusion of eyeball and orbital tissues, right eye, subsequent encounter: Secondary | ICD-10-CM | POA: Diagnosis not present

## 2020-01-20 DIAGNOSIS — E1151 Type 2 diabetes mellitus with diabetic peripheral angiopathy without gangrene: Secondary | ICD-10-CM | POA: Diagnosis not present

## 2020-01-20 DIAGNOSIS — R29810 Facial weakness: Secondary | ICD-10-CM | POA: Diagnosis not present

## 2020-01-20 DIAGNOSIS — J45909 Unspecified asthma, uncomplicated: Secondary | ICD-10-CM | POA: Diagnosis not present

## 2020-01-20 DIAGNOSIS — G459 Transient cerebral ischemic attack, unspecified: Secondary | ICD-10-CM | POA: Diagnosis not present

## 2020-01-20 DIAGNOSIS — S8012XD Contusion of left lower leg, subsequent encounter: Secondary | ICD-10-CM | POA: Diagnosis not present

## 2020-01-20 DIAGNOSIS — R296 Repeated falls: Secondary | ICD-10-CM | POA: Diagnosis not present

## 2020-01-20 DIAGNOSIS — R29898 Other symptoms and signs involving the musculoskeletal system: Secondary | ICD-10-CM | POA: Diagnosis not present

## 2020-01-20 DIAGNOSIS — I480 Paroxysmal atrial fibrillation: Secondary | ICD-10-CM | POA: Diagnosis not present

## 2020-01-20 DIAGNOSIS — E1122 Type 2 diabetes mellitus with diabetic chronic kidney disease: Secondary | ICD-10-CM | POA: Diagnosis not present

## 2020-01-20 DIAGNOSIS — I129 Hypertensive chronic kidney disease with stage 1 through stage 4 chronic kidney disease, or unspecified chronic kidney disease: Secondary | ICD-10-CM | POA: Diagnosis not present

## 2020-01-24 DIAGNOSIS — R29898 Other symptoms and signs involving the musculoskeletal system: Secondary | ICD-10-CM | POA: Diagnosis not present

## 2020-01-24 DIAGNOSIS — I110 Hypertensive heart disease with heart failure: Secondary | ICD-10-CM | POA: Diagnosis not present

## 2020-01-24 DIAGNOSIS — J45909 Unspecified asthma, uncomplicated: Secondary | ICD-10-CM | POA: Diagnosis not present

## 2020-01-24 DIAGNOSIS — S0511XD Contusion of eyeball and orbital tissues, right eye, subsequent encounter: Secondary | ICD-10-CM | POA: Diagnosis not present

## 2020-01-24 DIAGNOSIS — G459 Transient cerebral ischemic attack, unspecified: Secondary | ICD-10-CM | POA: Diagnosis not present

## 2020-01-24 DIAGNOSIS — E1122 Type 2 diabetes mellitus with diabetic chronic kidney disease: Secondary | ICD-10-CM | POA: Diagnosis not present

## 2020-01-24 DIAGNOSIS — I129 Hypertensive chronic kidney disease with stage 1 through stage 4 chronic kidney disease, or unspecified chronic kidney disease: Secondary | ICD-10-CM | POA: Diagnosis not present

## 2020-01-24 DIAGNOSIS — I4891 Unspecified atrial fibrillation: Secondary | ICD-10-CM | POA: Diagnosis not present

## 2020-01-24 DIAGNOSIS — S8012XD Contusion of left lower leg, subsequent encounter: Secondary | ICD-10-CM | POA: Diagnosis not present

## 2020-01-24 DIAGNOSIS — E7800 Pure hypercholesterolemia, unspecified: Secondary | ICD-10-CM | POA: Diagnosis not present

## 2020-01-24 DIAGNOSIS — I10 Essential (primary) hypertension: Secondary | ICD-10-CM | POA: Diagnosis not present

## 2020-01-24 DIAGNOSIS — E1151 Type 2 diabetes mellitus with diabetic peripheral angiopathy without gangrene: Secondary | ICD-10-CM | POA: Diagnosis not present

## 2020-01-24 DIAGNOSIS — I480 Paroxysmal atrial fibrillation: Secondary | ICD-10-CM | POA: Diagnosis not present

## 2020-01-24 DIAGNOSIS — R296 Repeated falls: Secondary | ICD-10-CM | POA: Diagnosis not present

## 2020-01-24 DIAGNOSIS — R29810 Facial weakness: Secondary | ICD-10-CM | POA: Diagnosis not present

## 2020-01-25 DIAGNOSIS — R296 Repeated falls: Secondary | ICD-10-CM | POA: Diagnosis not present

## 2020-01-25 DIAGNOSIS — G459 Transient cerebral ischemic attack, unspecified: Secondary | ICD-10-CM | POA: Diagnosis not present

## 2020-01-25 DIAGNOSIS — J45909 Unspecified asthma, uncomplicated: Secondary | ICD-10-CM | POA: Diagnosis not present

## 2020-01-25 DIAGNOSIS — E1122 Type 2 diabetes mellitus with diabetic chronic kidney disease: Secondary | ICD-10-CM | POA: Diagnosis not present

## 2020-01-25 DIAGNOSIS — S8012XD Contusion of left lower leg, subsequent encounter: Secondary | ICD-10-CM | POA: Diagnosis not present

## 2020-01-25 DIAGNOSIS — E1151 Type 2 diabetes mellitus with diabetic peripheral angiopathy without gangrene: Secondary | ICD-10-CM | POA: Diagnosis not present

## 2020-01-25 DIAGNOSIS — I480 Paroxysmal atrial fibrillation: Secondary | ICD-10-CM | POA: Diagnosis not present

## 2020-01-25 DIAGNOSIS — I129 Hypertensive chronic kidney disease with stage 1 through stage 4 chronic kidney disease, or unspecified chronic kidney disease: Secondary | ICD-10-CM | POA: Diagnosis not present

## 2020-01-25 DIAGNOSIS — R29810 Facial weakness: Secondary | ICD-10-CM | POA: Diagnosis not present

## 2020-01-25 DIAGNOSIS — S0511XD Contusion of eyeball and orbital tissues, right eye, subsequent encounter: Secondary | ICD-10-CM | POA: Diagnosis not present

## 2020-01-25 DIAGNOSIS — R29898 Other symptoms and signs involving the musculoskeletal system: Secondary | ICD-10-CM | POA: Diagnosis not present

## 2020-01-27 DIAGNOSIS — S8012XD Contusion of left lower leg, subsequent encounter: Secondary | ICD-10-CM | POA: Diagnosis not present

## 2020-01-27 DIAGNOSIS — R296 Repeated falls: Secondary | ICD-10-CM | POA: Diagnosis not present

## 2020-01-27 DIAGNOSIS — R29898 Other symptoms and signs involving the musculoskeletal system: Secondary | ICD-10-CM | POA: Diagnosis not present

## 2020-01-27 DIAGNOSIS — G459 Transient cerebral ischemic attack, unspecified: Secondary | ICD-10-CM | POA: Diagnosis not present

## 2020-01-27 DIAGNOSIS — J45909 Unspecified asthma, uncomplicated: Secondary | ICD-10-CM | POA: Diagnosis not present

## 2020-01-27 DIAGNOSIS — I129 Hypertensive chronic kidney disease with stage 1 through stage 4 chronic kidney disease, or unspecified chronic kidney disease: Secondary | ICD-10-CM | POA: Diagnosis not present

## 2020-01-27 DIAGNOSIS — E1151 Type 2 diabetes mellitus with diabetic peripheral angiopathy without gangrene: Secondary | ICD-10-CM | POA: Diagnosis not present

## 2020-01-27 DIAGNOSIS — R29810 Facial weakness: Secondary | ICD-10-CM | POA: Diagnosis not present

## 2020-01-27 DIAGNOSIS — E1122 Type 2 diabetes mellitus with diabetic chronic kidney disease: Secondary | ICD-10-CM | POA: Diagnosis not present

## 2020-01-27 DIAGNOSIS — S0511XD Contusion of eyeball and orbital tissues, right eye, subsequent encounter: Secondary | ICD-10-CM | POA: Diagnosis not present

## 2020-01-27 DIAGNOSIS — I480 Paroxysmal atrial fibrillation: Secondary | ICD-10-CM | POA: Diagnosis not present

## 2020-02-01 DIAGNOSIS — E1122 Type 2 diabetes mellitus with diabetic chronic kidney disease: Secondary | ICD-10-CM | POA: Diagnosis not present

## 2020-02-01 DIAGNOSIS — S0511XD Contusion of eyeball and orbital tissues, right eye, subsequent encounter: Secondary | ICD-10-CM | POA: Diagnosis not present

## 2020-02-01 DIAGNOSIS — I480 Paroxysmal atrial fibrillation: Secondary | ICD-10-CM | POA: Diagnosis not present

## 2020-02-01 DIAGNOSIS — S8012XD Contusion of left lower leg, subsequent encounter: Secondary | ICD-10-CM | POA: Diagnosis not present

## 2020-02-01 DIAGNOSIS — R29898 Other symptoms and signs involving the musculoskeletal system: Secondary | ICD-10-CM | POA: Diagnosis not present

## 2020-02-01 DIAGNOSIS — G459 Transient cerebral ischemic attack, unspecified: Secondary | ICD-10-CM | POA: Diagnosis not present

## 2020-02-01 DIAGNOSIS — E1151 Type 2 diabetes mellitus with diabetic peripheral angiopathy without gangrene: Secondary | ICD-10-CM | POA: Diagnosis not present

## 2020-02-01 DIAGNOSIS — J45909 Unspecified asthma, uncomplicated: Secondary | ICD-10-CM | POA: Diagnosis not present

## 2020-02-01 DIAGNOSIS — R29810 Facial weakness: Secondary | ICD-10-CM | POA: Diagnosis not present

## 2020-02-01 DIAGNOSIS — I129 Hypertensive chronic kidney disease with stage 1 through stage 4 chronic kidney disease, or unspecified chronic kidney disease: Secondary | ICD-10-CM | POA: Diagnosis not present

## 2020-02-01 DIAGNOSIS — R296 Repeated falls: Secondary | ICD-10-CM | POA: Diagnosis not present

## 2020-02-03 DIAGNOSIS — R296 Repeated falls: Secondary | ICD-10-CM | POA: Diagnosis not present

## 2020-02-03 DIAGNOSIS — R29810 Facial weakness: Secondary | ICD-10-CM | POA: Diagnosis not present

## 2020-02-03 DIAGNOSIS — R29898 Other symptoms and signs involving the musculoskeletal system: Secondary | ICD-10-CM | POA: Diagnosis not present

## 2020-02-03 DIAGNOSIS — E1151 Type 2 diabetes mellitus with diabetic peripheral angiopathy without gangrene: Secondary | ICD-10-CM | POA: Diagnosis not present

## 2020-02-03 DIAGNOSIS — G459 Transient cerebral ischemic attack, unspecified: Secondary | ICD-10-CM | POA: Diagnosis not present

## 2020-02-03 DIAGNOSIS — S8012XD Contusion of left lower leg, subsequent encounter: Secondary | ICD-10-CM | POA: Diagnosis not present

## 2020-02-03 DIAGNOSIS — E1122 Type 2 diabetes mellitus with diabetic chronic kidney disease: Secondary | ICD-10-CM | POA: Diagnosis not present

## 2020-02-03 DIAGNOSIS — S0511XD Contusion of eyeball and orbital tissues, right eye, subsequent encounter: Secondary | ICD-10-CM | POA: Diagnosis not present

## 2020-02-03 DIAGNOSIS — I480 Paroxysmal atrial fibrillation: Secondary | ICD-10-CM | POA: Diagnosis not present

## 2020-02-03 DIAGNOSIS — J45909 Unspecified asthma, uncomplicated: Secondary | ICD-10-CM | POA: Diagnosis not present

## 2020-02-03 DIAGNOSIS — I129 Hypertensive chronic kidney disease with stage 1 through stage 4 chronic kidney disease, or unspecified chronic kidney disease: Secondary | ICD-10-CM | POA: Diagnosis not present

## 2020-02-08 DIAGNOSIS — R29810 Facial weakness: Secondary | ICD-10-CM | POA: Diagnosis not present

## 2020-02-08 DIAGNOSIS — I129 Hypertensive chronic kidney disease with stage 1 through stage 4 chronic kidney disease, or unspecified chronic kidney disease: Secondary | ICD-10-CM | POA: Diagnosis not present

## 2020-02-08 DIAGNOSIS — I480 Paroxysmal atrial fibrillation: Secondary | ICD-10-CM | POA: Diagnosis not present

## 2020-02-08 DIAGNOSIS — E1122 Type 2 diabetes mellitus with diabetic chronic kidney disease: Secondary | ICD-10-CM | POA: Diagnosis not present

## 2020-02-08 DIAGNOSIS — J45909 Unspecified asthma, uncomplicated: Secondary | ICD-10-CM | POA: Diagnosis not present

## 2020-02-08 DIAGNOSIS — S0511XD Contusion of eyeball and orbital tissues, right eye, subsequent encounter: Secondary | ICD-10-CM | POA: Diagnosis not present

## 2020-02-08 DIAGNOSIS — E1151 Type 2 diabetes mellitus with diabetic peripheral angiopathy without gangrene: Secondary | ICD-10-CM | POA: Diagnosis not present

## 2020-02-08 DIAGNOSIS — R296 Repeated falls: Secondary | ICD-10-CM | POA: Diagnosis not present

## 2020-02-08 DIAGNOSIS — R29898 Other symptoms and signs involving the musculoskeletal system: Secondary | ICD-10-CM | POA: Diagnosis not present

## 2020-02-08 DIAGNOSIS — G459 Transient cerebral ischemic attack, unspecified: Secondary | ICD-10-CM | POA: Diagnosis not present

## 2020-02-08 DIAGNOSIS — S8012XD Contusion of left lower leg, subsequent encounter: Secondary | ICD-10-CM | POA: Diagnosis not present

## 2020-02-10 DIAGNOSIS — R296 Repeated falls: Secondary | ICD-10-CM | POA: Diagnosis not present

## 2020-02-10 DIAGNOSIS — J45909 Unspecified asthma, uncomplicated: Secondary | ICD-10-CM | POA: Diagnosis not present

## 2020-02-10 DIAGNOSIS — E1151 Type 2 diabetes mellitus with diabetic peripheral angiopathy without gangrene: Secondary | ICD-10-CM | POA: Diagnosis not present

## 2020-02-10 DIAGNOSIS — I480 Paroxysmal atrial fibrillation: Secondary | ICD-10-CM | POA: Diagnosis not present

## 2020-02-10 DIAGNOSIS — R29898 Other symptoms and signs involving the musculoskeletal system: Secondary | ICD-10-CM | POA: Diagnosis not present

## 2020-02-10 DIAGNOSIS — I129 Hypertensive chronic kidney disease with stage 1 through stage 4 chronic kidney disease, or unspecified chronic kidney disease: Secondary | ICD-10-CM | POA: Diagnosis not present

## 2020-02-10 DIAGNOSIS — E1122 Type 2 diabetes mellitus with diabetic chronic kidney disease: Secondary | ICD-10-CM | POA: Diagnosis not present

## 2020-02-10 DIAGNOSIS — R29810 Facial weakness: Secondary | ICD-10-CM | POA: Diagnosis not present

## 2020-02-10 DIAGNOSIS — S8012XD Contusion of left lower leg, subsequent encounter: Secondary | ICD-10-CM | POA: Diagnosis not present

## 2020-02-10 DIAGNOSIS — G459 Transient cerebral ischemic attack, unspecified: Secondary | ICD-10-CM | POA: Diagnosis not present

## 2020-02-10 DIAGNOSIS — S0511XD Contusion of eyeball and orbital tissues, right eye, subsequent encounter: Secondary | ICD-10-CM | POA: Diagnosis not present

## 2020-02-15 DIAGNOSIS — S8012XD Contusion of left lower leg, subsequent encounter: Secondary | ICD-10-CM | POA: Diagnosis not present

## 2020-02-15 DIAGNOSIS — I480 Paroxysmal atrial fibrillation: Secondary | ICD-10-CM | POA: Diagnosis not present

## 2020-02-15 DIAGNOSIS — I129 Hypertensive chronic kidney disease with stage 1 through stage 4 chronic kidney disease, or unspecified chronic kidney disease: Secondary | ICD-10-CM | POA: Diagnosis not present

## 2020-02-15 DIAGNOSIS — R29810 Facial weakness: Secondary | ICD-10-CM | POA: Diagnosis not present

## 2020-02-15 DIAGNOSIS — E1151 Type 2 diabetes mellitus with diabetic peripheral angiopathy without gangrene: Secondary | ICD-10-CM | POA: Diagnosis not present

## 2020-02-15 DIAGNOSIS — R29898 Other symptoms and signs involving the musculoskeletal system: Secondary | ICD-10-CM | POA: Diagnosis not present

## 2020-02-15 DIAGNOSIS — G459 Transient cerebral ischemic attack, unspecified: Secondary | ICD-10-CM | POA: Diagnosis not present

## 2020-02-15 DIAGNOSIS — J45909 Unspecified asthma, uncomplicated: Secondary | ICD-10-CM | POA: Diagnosis not present

## 2020-02-15 DIAGNOSIS — E1122 Type 2 diabetes mellitus with diabetic chronic kidney disease: Secondary | ICD-10-CM | POA: Diagnosis not present

## 2020-02-15 DIAGNOSIS — R296 Repeated falls: Secondary | ICD-10-CM | POA: Diagnosis not present

## 2020-02-15 DIAGNOSIS — S0511XD Contusion of eyeball and orbital tissues, right eye, subsequent encounter: Secondary | ICD-10-CM | POA: Diagnosis not present

## 2020-02-20 DIAGNOSIS — I129 Hypertensive chronic kidney disease with stage 1 through stage 4 chronic kidney disease, or unspecified chronic kidney disease: Secondary | ICD-10-CM | POA: Diagnosis not present

## 2020-02-20 DIAGNOSIS — R29898 Other symptoms and signs involving the musculoskeletal system: Secondary | ICD-10-CM | POA: Diagnosis not present

## 2020-02-20 DIAGNOSIS — J45909 Unspecified asthma, uncomplicated: Secondary | ICD-10-CM | POA: Diagnosis not present

## 2020-02-20 DIAGNOSIS — G459 Transient cerebral ischemic attack, unspecified: Secondary | ICD-10-CM | POA: Diagnosis not present

## 2020-02-20 DIAGNOSIS — E1151 Type 2 diabetes mellitus with diabetic peripheral angiopathy without gangrene: Secondary | ICD-10-CM | POA: Diagnosis not present

## 2020-02-20 DIAGNOSIS — I480 Paroxysmal atrial fibrillation: Secondary | ICD-10-CM | POA: Diagnosis not present

## 2020-02-20 DIAGNOSIS — S0511XD Contusion of eyeball and orbital tissues, right eye, subsequent encounter: Secondary | ICD-10-CM | POA: Diagnosis not present

## 2020-02-20 DIAGNOSIS — R296 Repeated falls: Secondary | ICD-10-CM | POA: Diagnosis not present

## 2020-02-20 DIAGNOSIS — E1122 Type 2 diabetes mellitus with diabetic chronic kidney disease: Secondary | ICD-10-CM | POA: Diagnosis not present

## 2020-02-20 DIAGNOSIS — S8012XD Contusion of left lower leg, subsequent encounter: Secondary | ICD-10-CM | POA: Diagnosis not present

## 2020-02-20 DIAGNOSIS — R29810 Facial weakness: Secondary | ICD-10-CM | POA: Diagnosis not present

## 2020-02-21 ENCOUNTER — Ambulatory Visit: Payer: Medicare Other | Admitting: Neurology

## 2020-02-23 DIAGNOSIS — I110 Hypertensive heart disease with heart failure: Secondary | ICD-10-CM | POA: Diagnosis not present

## 2020-02-23 DIAGNOSIS — E7800 Pure hypercholesterolemia, unspecified: Secondary | ICD-10-CM | POA: Diagnosis not present

## 2020-03-26 DIAGNOSIS — I10 Essential (primary) hypertension: Secondary | ICD-10-CM | POA: Diagnosis not present

## 2020-04-09 DIAGNOSIS — E7800 Pure hypercholesterolemia, unspecified: Secondary | ICD-10-CM | POA: Diagnosis not present

## 2020-04-09 DIAGNOSIS — I10 Essential (primary) hypertension: Secondary | ICD-10-CM | POA: Diagnosis not present

## 2020-04-09 DIAGNOSIS — I480 Paroxysmal atrial fibrillation: Secondary | ICD-10-CM | POA: Diagnosis not present

## 2020-04-23 DIAGNOSIS — K219 Gastro-esophageal reflux disease without esophagitis: Secondary | ICD-10-CM | POA: Diagnosis not present

## 2020-04-23 DIAGNOSIS — I10 Essential (primary) hypertension: Secondary | ICD-10-CM | POA: Diagnosis not present

## 2020-04-23 DIAGNOSIS — I110 Hypertensive heart disease with heart failure: Secondary | ICD-10-CM | POA: Diagnosis not present

## 2020-04-23 DIAGNOSIS — E7800 Pure hypercholesterolemia, unspecified: Secondary | ICD-10-CM | POA: Diagnosis not present

## 2020-05-04 DIAGNOSIS — H401123 Primary open-angle glaucoma, left eye, severe stage: Secondary | ICD-10-CM | POA: Diagnosis not present

## 2020-05-24 DIAGNOSIS — I110 Hypertensive heart disease with heart failure: Secondary | ICD-10-CM | POA: Diagnosis not present

## 2020-05-24 DIAGNOSIS — I10 Essential (primary) hypertension: Secondary | ICD-10-CM | POA: Diagnosis not present

## 2020-06-18 DIAGNOSIS — Z79899 Other long term (current) drug therapy: Secondary | ICD-10-CM | POA: Diagnosis not present

## 2020-06-18 DIAGNOSIS — Z7189 Other specified counseling: Secondary | ICD-10-CM | POA: Diagnosis not present

## 2020-06-18 DIAGNOSIS — E7800 Pure hypercholesterolemia, unspecified: Secondary | ICD-10-CM | POA: Diagnosis not present

## 2020-06-18 DIAGNOSIS — Z713 Dietary counseling and surveillance: Secondary | ICD-10-CM | POA: Diagnosis not present

## 2020-06-18 DIAGNOSIS — Z0000 Encounter for general adult medical examination without abnormal findings: Secondary | ICD-10-CM | POA: Diagnosis not present

## 2020-06-18 DIAGNOSIS — Z299 Encounter for prophylactic measures, unspecified: Secondary | ICD-10-CM | POA: Diagnosis not present

## 2020-06-18 DIAGNOSIS — R5383 Other fatigue: Secondary | ICD-10-CM | POA: Diagnosis not present

## 2020-06-22 DIAGNOSIS — I110 Hypertensive heart disease with heart failure: Secondary | ICD-10-CM | POA: Diagnosis not present

## 2020-06-22 DIAGNOSIS — I10 Essential (primary) hypertension: Secondary | ICD-10-CM | POA: Diagnosis not present

## 2020-07-19 DIAGNOSIS — L905 Scar conditions and fibrosis of skin: Secondary | ICD-10-CM | POA: Diagnosis not present

## 2020-07-19 DIAGNOSIS — C44311 Basal cell carcinoma of skin of nose: Secondary | ICD-10-CM | POA: Diagnosis not present

## 2020-07-19 DIAGNOSIS — L820 Inflamed seborrheic keratosis: Secondary | ICD-10-CM | POA: Diagnosis not present

## 2020-07-23 DIAGNOSIS — K219 Gastro-esophageal reflux disease without esophagitis: Secondary | ICD-10-CM | POA: Diagnosis not present

## 2020-07-23 DIAGNOSIS — E7800 Pure hypercholesterolemia, unspecified: Secondary | ICD-10-CM | POA: Diagnosis not present

## 2020-07-23 DIAGNOSIS — I10 Essential (primary) hypertension: Secondary | ICD-10-CM | POA: Diagnosis not present

## 2020-07-24 DIAGNOSIS — I10 Essential (primary) hypertension: Secondary | ICD-10-CM | POA: Diagnosis not present

## 2020-08-23 DIAGNOSIS — I10 Essential (primary) hypertension: Secondary | ICD-10-CM | POA: Diagnosis not present

## 2020-09-03 DIAGNOSIS — C44311 Basal cell carcinoma of skin of nose: Secondary | ICD-10-CM | POA: Diagnosis not present

## 2020-09-21 DIAGNOSIS — I10 Essential (primary) hypertension: Secondary | ICD-10-CM | POA: Diagnosis not present

## 2020-09-23 DIAGNOSIS — I10 Essential (primary) hypertension: Secondary | ICD-10-CM | POA: Diagnosis not present

## 2020-10-08 DIAGNOSIS — E7800 Pure hypercholesterolemia, unspecified: Secondary | ICD-10-CM | POA: Diagnosis not present

## 2020-10-08 DIAGNOSIS — I10 Essential (primary) hypertension: Secondary | ICD-10-CM | POA: Diagnosis not present

## 2020-10-08 DIAGNOSIS — I480 Paroxysmal atrial fibrillation: Secondary | ICD-10-CM | POA: Diagnosis not present

## 2020-10-24 DIAGNOSIS — K219 Gastro-esophageal reflux disease without esophagitis: Secondary | ICD-10-CM | POA: Diagnosis not present

## 2020-10-24 DIAGNOSIS — E7800 Pure hypercholesterolemia, unspecified: Secondary | ICD-10-CM | POA: Diagnosis not present

## 2020-10-24 DIAGNOSIS — I10 Essential (primary) hypertension: Secondary | ICD-10-CM | POA: Diagnosis not present

## 2020-11-23 DIAGNOSIS — I10 Essential (primary) hypertension: Secondary | ICD-10-CM | POA: Diagnosis not present

## 2020-12-24 DIAGNOSIS — I10 Essential (primary) hypertension: Secondary | ICD-10-CM | POA: Diagnosis not present

## 2021-01-23 DIAGNOSIS — E7800 Pure hypercholesterolemia, unspecified: Secondary | ICD-10-CM | POA: Diagnosis not present

## 2021-01-23 DIAGNOSIS — I10 Essential (primary) hypertension: Secondary | ICD-10-CM | POA: Diagnosis not present

## 2021-02-06 DIAGNOSIS — I4891 Unspecified atrial fibrillation: Secondary | ICD-10-CM | POA: Diagnosis not present

## 2021-02-06 DIAGNOSIS — Z299 Encounter for prophylactic measures, unspecified: Secondary | ICD-10-CM | POA: Diagnosis not present

## 2021-02-06 DIAGNOSIS — D6869 Other thrombophilia: Secondary | ICD-10-CM | POA: Diagnosis not present

## 2021-02-06 DIAGNOSIS — I10 Essential (primary) hypertension: Secondary | ICD-10-CM | POA: Diagnosis not present

## 2021-02-22 DIAGNOSIS — I10 Essential (primary) hypertension: Secondary | ICD-10-CM | POA: Diagnosis not present

## 2021-03-24 DIAGNOSIS — I10 Essential (primary) hypertension: Secondary | ICD-10-CM | POA: Diagnosis not present

## 2021-04-09 DIAGNOSIS — I10 Essential (primary) hypertension: Secondary | ICD-10-CM | POA: Diagnosis not present

## 2021-04-09 DIAGNOSIS — I4891 Unspecified atrial fibrillation: Secondary | ICD-10-CM | POA: Diagnosis not present

## 2021-04-09 DIAGNOSIS — J449 Chronic obstructive pulmonary disease, unspecified: Secondary | ICD-10-CM | POA: Diagnosis not present

## 2021-04-09 DIAGNOSIS — I700 Atherosclerosis of aorta: Secondary | ICD-10-CM | POA: Diagnosis not present

## 2021-04-09 DIAGNOSIS — Z299 Encounter for prophylactic measures, unspecified: Secondary | ICD-10-CM | POA: Diagnosis not present

## 2021-04-23 DIAGNOSIS — I10 Essential (primary) hypertension: Secondary | ICD-10-CM | POA: Diagnosis not present

## 2021-05-02 DIAGNOSIS — E7800 Pure hypercholesterolemia, unspecified: Secondary | ICD-10-CM | POA: Diagnosis not present

## 2021-05-02 DIAGNOSIS — I10 Essential (primary) hypertension: Secondary | ICD-10-CM | POA: Diagnosis not present

## 2021-05-02 DIAGNOSIS — I480 Paroxysmal atrial fibrillation: Secondary | ICD-10-CM | POA: Diagnosis not present

## 2021-05-02 DIAGNOSIS — I2510 Atherosclerotic heart disease of native coronary artery without angina pectoris: Secondary | ICD-10-CM | POA: Diagnosis not present

## 2021-05-09 DIAGNOSIS — H401123 Primary open-angle glaucoma, left eye, severe stage: Secondary | ICD-10-CM | POA: Diagnosis not present

## 2021-05-20 DIAGNOSIS — Z0000 Encounter for general adult medical examination without abnormal findings: Secondary | ICD-10-CM | POA: Diagnosis not present

## 2021-05-20 DIAGNOSIS — Z7189 Other specified counseling: Secondary | ICD-10-CM | POA: Diagnosis not present

## 2021-05-20 DIAGNOSIS — Z713 Dietary counseling and surveillance: Secondary | ICD-10-CM | POA: Diagnosis not present

## 2021-05-20 DIAGNOSIS — Z299 Encounter for prophylactic measures, unspecified: Secondary | ICD-10-CM | POA: Diagnosis not present

## 2021-05-20 DIAGNOSIS — Z6824 Body mass index (BMI) 24.0-24.9, adult: Secondary | ICD-10-CM | POA: Diagnosis not present

## 2021-05-20 DIAGNOSIS — I10 Essential (primary) hypertension: Secondary | ICD-10-CM | POA: Diagnosis not present

## 2021-05-23 DIAGNOSIS — E7800 Pure hypercholesterolemia, unspecified: Secondary | ICD-10-CM | POA: Diagnosis not present

## 2021-05-23 DIAGNOSIS — I10 Essential (primary) hypertension: Secondary | ICD-10-CM | POA: Diagnosis not present

## 2021-05-30 DIAGNOSIS — H401123 Primary open-angle glaucoma, left eye, severe stage: Secondary | ICD-10-CM | POA: Diagnosis not present

## 2021-06-23 DIAGNOSIS — I10 Essential (primary) hypertension: Secondary | ICD-10-CM | POA: Diagnosis not present

## 2021-06-24 DIAGNOSIS — I10 Essential (primary) hypertension: Secondary | ICD-10-CM | POA: Diagnosis not present

## 2021-06-24 DIAGNOSIS — Z0000 Encounter for general adult medical examination without abnormal findings: Secondary | ICD-10-CM | POA: Diagnosis not present

## 2021-06-24 DIAGNOSIS — Z299 Encounter for prophylactic measures, unspecified: Secondary | ICD-10-CM | POA: Diagnosis not present

## 2021-06-24 DIAGNOSIS — E7800 Pure hypercholesterolemia, unspecified: Secondary | ICD-10-CM | POA: Diagnosis not present

## 2021-06-24 DIAGNOSIS — R5383 Other fatigue: Secondary | ICD-10-CM | POA: Diagnosis not present

## 2021-06-24 DIAGNOSIS — Z79899 Other long term (current) drug therapy: Secondary | ICD-10-CM | POA: Diagnosis not present

## 2021-06-24 DIAGNOSIS — Z6824 Body mass index (BMI) 24.0-24.9, adult: Secondary | ICD-10-CM | POA: Diagnosis not present

## 2021-06-25 DIAGNOSIS — H401123 Primary open-angle glaucoma, left eye, severe stage: Secondary | ICD-10-CM | POA: Diagnosis not present

## 2021-06-25 DIAGNOSIS — Z961 Presence of intraocular lens: Secondary | ICD-10-CM | POA: Diagnosis not present

## 2021-07-18 DIAGNOSIS — L570 Actinic keratosis: Secondary | ICD-10-CM | POA: Diagnosis not present

## 2021-07-18 DIAGNOSIS — C44311 Basal cell carcinoma of skin of nose: Secondary | ICD-10-CM | POA: Diagnosis not present

## 2021-07-18 DIAGNOSIS — Z85820 Personal history of malignant melanoma of skin: Secondary | ICD-10-CM | POA: Diagnosis not present

## 2021-07-18 DIAGNOSIS — X32XXXD Exposure to sunlight, subsequent encounter: Secondary | ICD-10-CM | POA: Diagnosis not present

## 2021-07-18 DIAGNOSIS — Z08 Encounter for follow-up examination after completed treatment for malignant neoplasm: Secondary | ICD-10-CM | POA: Diagnosis not present

## 2021-07-23 DIAGNOSIS — I10 Essential (primary) hypertension: Secondary | ICD-10-CM | POA: Diagnosis not present

## 2021-08-22 DIAGNOSIS — I10 Essential (primary) hypertension: Secondary | ICD-10-CM | POA: Diagnosis not present

## 2021-08-28 DIAGNOSIS — I10 Essential (primary) hypertension: Secondary | ICD-10-CM | POA: Diagnosis not present

## 2021-08-28 DIAGNOSIS — I4891 Unspecified atrial fibrillation: Secondary | ICD-10-CM | POA: Diagnosis not present

## 2021-08-28 DIAGNOSIS — D6869 Other thrombophilia: Secondary | ICD-10-CM | POA: Diagnosis not present

## 2021-09-23 DIAGNOSIS — I10 Essential (primary) hypertension: Secondary | ICD-10-CM | POA: Diagnosis not present

## 2021-10-14 DIAGNOSIS — H401123 Primary open-angle glaucoma, left eye, severe stage: Secondary | ICD-10-CM | POA: Diagnosis not present

## 2021-10-23 DIAGNOSIS — I10 Essential (primary) hypertension: Secondary | ICD-10-CM | POA: Diagnosis not present

## 2021-11-07 DIAGNOSIS — I10 Essential (primary) hypertension: Secondary | ICD-10-CM | POA: Diagnosis not present

## 2021-11-07 DIAGNOSIS — I480 Paroxysmal atrial fibrillation: Secondary | ICD-10-CM | POA: Diagnosis not present

## 2021-11-07 DIAGNOSIS — I2510 Atherosclerotic heart disease of native coronary artery without angina pectoris: Secondary | ICD-10-CM | POA: Diagnosis not present

## 2021-11-07 DIAGNOSIS — E7800 Pure hypercholesterolemia, unspecified: Secondary | ICD-10-CM | POA: Diagnosis not present

## 2021-11-22 DIAGNOSIS — I10 Essential (primary) hypertension: Secondary | ICD-10-CM | POA: Diagnosis not present

## 2021-12-23 DIAGNOSIS — I10 Essential (primary) hypertension: Secondary | ICD-10-CM | POA: Diagnosis not present

## 2022-01-08 DIAGNOSIS — H401123 Primary open-angle glaucoma, left eye, severe stage: Secondary | ICD-10-CM | POA: Diagnosis not present

## 2022-01-22 DIAGNOSIS — I10 Essential (primary) hypertension: Secondary | ICD-10-CM | POA: Diagnosis not present

## 2022-02-21 DIAGNOSIS — I10 Essential (primary) hypertension: Secondary | ICD-10-CM | POA: Diagnosis not present

## 2022-03-21 IMAGING — CT CT ANGIO HEAD
1 of 9 series · 3 of 20 positions shown · IV contrast (iopamidol)
Comparison: MRI 09/06/2013

CLINICAL DATA: Facial numbness and right hand numbness today.

EXAM:
CT ANGIOGRAPHY HEAD AND NECK
TECHNIQUE: Multidetector CT imaging of the head and neck was performed using
the standard protocol during bolus administration of intravenous
contrast. Multiplanar CT image reconstructions and MIPs were
obtained to evaluate the vascular anatomy. Carotid stenosis
measurements (when applicable) are obtained utilizing NASCET
criteria, using the distal internal carotid diameter as the
denominator.
CONTRAST:  60mL 57QSQH-KNB IOPAMIDOL (57QSQH-KNB) INJECTION 76%

[Series 18: cta head & neck 1.00 hv48 s3 ax thin mips · axial · 0.66mm/px · z∈[-798,-459]mm · 3 of 340 slices shown]
[im 1/340  soft-tissue]
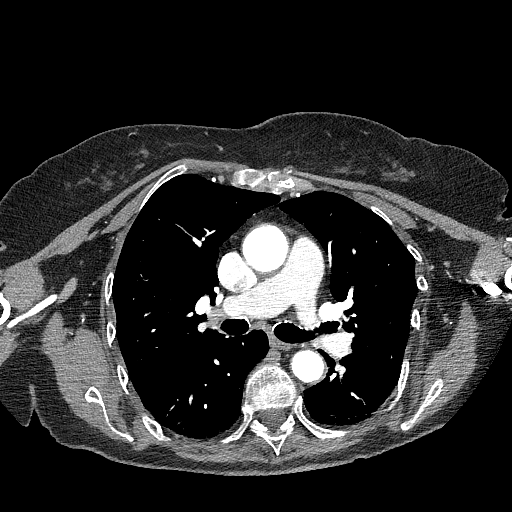
[im 170/340  bone]
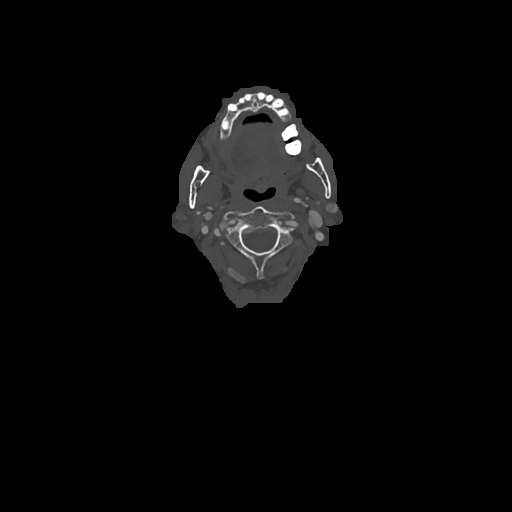
[im 340/340  soft-tissue]
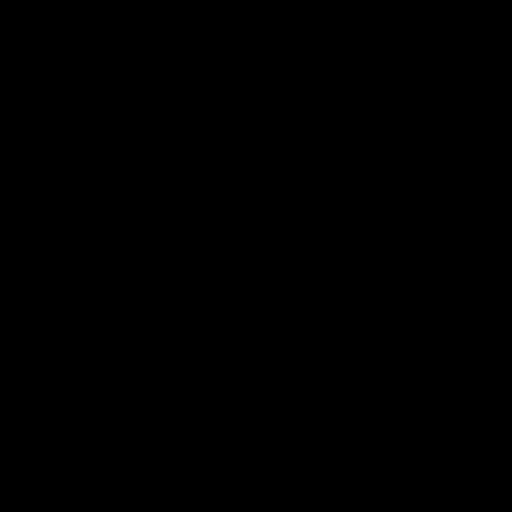

[3 of 20 positions shown; findings below may reference images not displayed]

FINDINGS: CT HEAD FINDINGS

Brain: Mild age related volume loss. Old infarction in the right
cerebellum. No sign of acute infarction, mass lesion, hemorrhage,
hydrocephalus or extra-axial collection.

Vascular: There is atherosclerotic calcification of the major
vessels at the base of the brain.

Skull: Negative

Sinuses: Clear

Orbits: Normal

Review of the MIP images confirms the above findings

CTA NECK FINDINGS

Aortic arch: Aortic atherosclerosis. No aneurysm or dissection.
Branching pattern is normal. 25% stenosis of the proximal left
subclavian artery.

Right carotid system: Common carotid artery widely patent to the
bifurcation. Calcified plaque at the carotid bifurcation but no ICA
stenosis. Cervical ICA widely patent.

Left carotid system: Common carotid artery widely patent to the
bifurcation. Calcified plaque at the carotid bifurcation but no ICA
stenosis. Cervical ICA widely patent.

Vertebral arteries: Both vertebral artery origins are widely patent.
Both vertebral arteries appear widely patent through the cervical
region to the foramen magnum.

Skeleton: Ordinary cervical spondylosis of a mild degree.

Other neck: No mass or lymphadenopathy.

Upper chest: Lungs are clear. No pleural fluid.

Review of the MIP images confirms the above findings

CTA HEAD FINDINGS

Anterior circulation: Both internal carotid arteries are widely
patent through the skull base and siphon regions. Ordinary siphon
atherosclerotic calcification but no stenosis greater than 30%. The
anterior and middle cerebral vessels are patent without proximal
stenosis or aneurysm. No sign of large or medium vessel occlusion.

Posterior circulation: Both vertebral arteries widely patent through
the foramen magnum to the basilar. No basilar stenosis. Posterior
circulation branch vessels are patent.

Venous sinuses: Patent and normal.

Anatomic variants: None significant.

Review of the MIP images confirms the above findings
IMPRESSION: 1. No acute finding by CT. Old right cerebellar infarction.
2. Aortic atherosclerosis. No aneurysm or dissection.
3. Mild atherosclerotic disease at both carotid bifurcations but no
ICA stenosis or irregularity.
4. No intracranial large or medium vessel occlusion or correctable
proximal stenosis.
5. 25% stenosis of the proximal left subclavian artery.

Aortic Atherosclerosis ([id number]).

## 2022-03-24 DIAGNOSIS — I10 Essential (primary) hypertension: Secondary | ICD-10-CM | POA: Diagnosis not present

## 2022-04-11 DIAGNOSIS — I10 Essential (primary) hypertension: Secondary | ICD-10-CM | POA: Diagnosis not present

## 2022-04-11 DIAGNOSIS — I480 Paroxysmal atrial fibrillation: Secondary | ICD-10-CM | POA: Diagnosis not present

## 2022-04-11 DIAGNOSIS — I708 Atherosclerosis of other arteries: Secondary | ICD-10-CM | POA: Diagnosis not present

## 2022-04-11 DIAGNOSIS — I3481 Nonrheumatic mitral (valve) annulus calcification: Secondary | ICD-10-CM | POA: Diagnosis not present

## 2022-04-11 DIAGNOSIS — N183 Chronic kidney disease, stage 3 unspecified: Secondary | ICD-10-CM | POA: Diagnosis not present

## 2022-04-11 DIAGNOSIS — Z7982 Long term (current) use of aspirin: Secondary | ICD-10-CM | POA: Diagnosis not present

## 2022-04-11 DIAGNOSIS — K219 Gastro-esophageal reflux disease without esophagitis: Secondary | ICD-10-CM | POA: Diagnosis not present

## 2022-04-11 DIAGNOSIS — E785 Hyperlipidemia, unspecified: Secondary | ICD-10-CM | POA: Diagnosis not present

## 2022-04-11 DIAGNOSIS — R471 Dysarthria and anarthria: Secondary | ICD-10-CM | POA: Diagnosis not present

## 2022-04-11 DIAGNOSIS — R531 Weakness: Secondary | ICD-10-CM | POA: Diagnosis not present

## 2022-04-11 DIAGNOSIS — G459 Transient cerebral ischemic attack, unspecified: Secondary | ICD-10-CM | POA: Diagnosis not present

## 2022-04-11 DIAGNOSIS — R29810 Facial weakness: Secondary | ICD-10-CM | POA: Diagnosis not present

## 2022-04-11 DIAGNOSIS — R4781 Slurred speech: Secondary | ICD-10-CM | POA: Diagnosis not present

## 2022-04-11 DIAGNOSIS — I771 Stricture of artery: Secondary | ICD-10-CM | POA: Diagnosis not present

## 2022-04-11 DIAGNOSIS — E1122 Type 2 diabetes mellitus with diabetic chronic kidney disease: Secondary | ICD-10-CM | POA: Diagnosis not present

## 2022-04-11 DIAGNOSIS — R4701 Aphasia: Secondary | ICD-10-CM | POA: Diagnosis not present

## 2022-04-11 DIAGNOSIS — I129 Hypertensive chronic kidney disease with stage 1 through stage 4 chronic kidney disease, or unspecified chronic kidney disease: Secondary | ICD-10-CM | POA: Diagnosis not present

## 2022-04-11 DIAGNOSIS — I6521 Occlusion and stenosis of right carotid artery: Secondary | ICD-10-CM | POA: Diagnosis not present

## 2022-04-11 DIAGNOSIS — Z79899 Other long term (current) drug therapy: Secondary | ICD-10-CM | POA: Diagnosis not present

## 2022-04-11 DIAGNOSIS — Z9049 Acquired absence of other specified parts of digestive tract: Secondary | ICD-10-CM | POA: Diagnosis not present

## 2022-04-11 DIAGNOSIS — Z7901 Long term (current) use of anticoagulants: Secondary | ICD-10-CM | POA: Diagnosis not present

## 2022-04-11 DIAGNOSIS — I2510 Atherosclerotic heart disease of native coronary artery without angina pectoris: Secondary | ICD-10-CM | POA: Diagnosis not present

## 2022-04-11 DIAGNOSIS — I959 Hypotension, unspecified: Secondary | ICD-10-CM | POA: Diagnosis not present

## 2022-04-12 DIAGNOSIS — Z7982 Long term (current) use of aspirin: Secondary | ICD-10-CM | POA: Diagnosis not present

## 2022-04-12 DIAGNOSIS — I480 Paroxysmal atrial fibrillation: Secondary | ICD-10-CM | POA: Diagnosis not present

## 2022-04-12 DIAGNOSIS — I129 Hypertensive chronic kidney disease with stage 1 through stage 4 chronic kidney disease, or unspecified chronic kidney disease: Secondary | ICD-10-CM | POA: Diagnosis not present

## 2022-04-12 DIAGNOSIS — E785 Hyperlipidemia, unspecified: Secondary | ICD-10-CM | POA: Diagnosis not present

## 2022-04-12 DIAGNOSIS — I2510 Atherosclerotic heart disease of native coronary artery without angina pectoris: Secondary | ICD-10-CM | POA: Diagnosis not present

## 2022-04-12 DIAGNOSIS — E1122 Type 2 diabetes mellitus with diabetic chronic kidney disease: Secondary | ICD-10-CM | POA: Diagnosis not present

## 2022-04-12 DIAGNOSIS — Z79899 Other long term (current) drug therapy: Secondary | ICD-10-CM | POA: Diagnosis not present

## 2022-04-12 DIAGNOSIS — G459 Transient cerebral ischemic attack, unspecified: Secondary | ICD-10-CM | POA: Diagnosis not present

## 2022-04-12 DIAGNOSIS — N183 Chronic kidney disease, stage 3 unspecified: Secondary | ICD-10-CM | POA: Diagnosis not present

## 2022-04-13 DIAGNOSIS — Z7901 Long term (current) use of anticoagulants: Secondary | ICD-10-CM | POA: Diagnosis not present

## 2022-04-13 DIAGNOSIS — I130 Hypertensive heart and chronic kidney disease with heart failure and stage 1 through stage 4 chronic kidney disease, or unspecified chronic kidney disease: Secondary | ICD-10-CM | POA: Diagnosis not present

## 2022-04-13 DIAGNOSIS — G459 Transient cerebral ischemic attack, unspecified: Secondary | ICD-10-CM | POA: Diagnosis not present

## 2022-04-13 DIAGNOSIS — R29818 Other symptoms and signs involving the nervous system: Secondary | ICD-10-CM | POA: Diagnosis not present

## 2022-04-13 DIAGNOSIS — I480 Paroxysmal atrial fibrillation: Secondary | ICD-10-CM | POA: Diagnosis not present

## 2022-04-13 DIAGNOSIS — N183 Chronic kidney disease, stage 3 unspecified: Secondary | ICD-10-CM | POA: Diagnosis not present

## 2022-04-13 DIAGNOSIS — E1122 Type 2 diabetes mellitus with diabetic chronic kidney disease: Secondary | ICD-10-CM | POA: Diagnosis not present

## 2022-04-13 DIAGNOSIS — I5032 Chronic diastolic (congestive) heart failure: Secondary | ICD-10-CM | POA: Diagnosis not present

## 2022-04-13 DIAGNOSIS — E785 Hyperlipidemia, unspecified: Secondary | ICD-10-CM | POA: Diagnosis not present

## 2022-04-13 DIAGNOSIS — Z7982 Long term (current) use of aspirin: Secondary | ICD-10-CM | POA: Diagnosis not present

## 2022-04-13 DIAGNOSIS — Z79899 Other long term (current) drug therapy: Secondary | ICD-10-CM | POA: Diagnosis not present

## 2022-04-14 DIAGNOSIS — E785 Hyperlipidemia, unspecified: Secondary | ICD-10-CM | POA: Diagnosis not present

## 2022-04-14 DIAGNOSIS — I2510 Atherosclerotic heart disease of native coronary artery without angina pectoris: Secondary | ICD-10-CM | POA: Diagnosis not present

## 2022-04-14 DIAGNOSIS — I130 Hypertensive heart and chronic kidney disease with heart failure and stage 1 through stage 4 chronic kidney disease, or unspecified chronic kidney disease: Secondary | ICD-10-CM | POA: Diagnosis not present

## 2022-04-14 DIAGNOSIS — E7800 Pure hypercholesterolemia, unspecified: Secondary | ICD-10-CM | POA: Diagnosis not present

## 2022-04-14 DIAGNOSIS — E1165 Type 2 diabetes mellitus with hyperglycemia: Secondary | ICD-10-CM | POA: Diagnosis not present

## 2022-04-14 DIAGNOSIS — G459 Transient cerebral ischemic attack, unspecified: Secondary | ICD-10-CM | POA: Diagnosis not present

## 2022-04-14 DIAGNOSIS — I480 Paroxysmal atrial fibrillation: Secondary | ICD-10-CM | POA: Diagnosis not present

## 2022-04-14 DIAGNOSIS — I517 Cardiomegaly: Secondary | ICD-10-CM | POA: Diagnosis not present

## 2022-04-14 DIAGNOSIS — N183 Chronic kidney disease, stage 3 unspecified: Secondary | ICD-10-CM | POA: Diagnosis not present

## 2022-04-14 DIAGNOSIS — I5032 Chronic diastolic (congestive) heart failure: Secondary | ICD-10-CM | POA: Diagnosis not present

## 2022-04-14 DIAGNOSIS — Z7901 Long term (current) use of anticoagulants: Secondary | ICD-10-CM | POA: Diagnosis not present

## 2022-04-14 DIAGNOSIS — E1122 Type 2 diabetes mellitus with diabetic chronic kidney disease: Secondary | ICD-10-CM | POA: Diagnosis not present

## 2022-04-19 ENCOUNTER — Emergency Department (HOSPITAL_COMMUNITY)
Admission: EM | Admit: 2022-04-19 | Discharge: 2022-04-19 | Disposition: A | Discharge status: Home / Self Care | Payer: Medicare Other | Attending: Emergency Medicine | Admitting: Emergency Medicine

## 2022-04-19 ENCOUNTER — Emergency Department (HOSPITAL_COMMUNITY): Payer: Medicare Other

## 2022-04-19 ENCOUNTER — Encounter (HOSPITAL_COMMUNITY): Payer: Self-pay

## 2022-04-19 ENCOUNTER — Other Ambulatory Visit: Payer: Self-pay

## 2022-04-19 DIAGNOSIS — R519 Headache, unspecified: Secondary | ICD-10-CM | POA: Diagnosis not present

## 2022-04-19 DIAGNOSIS — Z7901 Long term (current) use of anticoagulants: Secondary | ICD-10-CM | POA: Insufficient documentation

## 2022-04-19 DIAGNOSIS — J90 Pleural effusion, not elsewhere classified: Secondary | ICD-10-CM | POA: Diagnosis not present

## 2022-04-19 DIAGNOSIS — R4781 Slurred speech: Secondary | ICD-10-CM | POA: Diagnosis not present

## 2022-04-19 DIAGNOSIS — R079 Chest pain, unspecified: Secondary | ICD-10-CM | POA: Diagnosis not present

## 2022-04-19 DIAGNOSIS — G4489 Other headache syndrome: Secondary | ICD-10-CM | POA: Diagnosis not present

## 2022-04-19 DIAGNOSIS — Z79899 Other long term (current) drug therapy: Secondary | ICD-10-CM | POA: Diagnosis not present

## 2022-04-19 DIAGNOSIS — R29810 Facial weakness: Secondary | ICD-10-CM | POA: Insufficient documentation

## 2022-04-19 DIAGNOSIS — R2990 Unspecified symptoms and signs involving the nervous system: Secondary | ICD-10-CM

## 2022-04-19 DIAGNOSIS — R531 Weakness: Secondary | ICD-10-CM | POA: Insufficient documentation

## 2022-04-19 DIAGNOSIS — R29898 Other symptoms and signs involving the musculoskeletal system: Secondary | ICD-10-CM

## 2022-04-19 DIAGNOSIS — R0902 Hypoxemia: Secondary | ICD-10-CM | POA: Diagnosis not present

## 2022-04-19 DIAGNOSIS — G459 Transient cerebral ischemic attack, unspecified: Secondary | ICD-10-CM | POA: Diagnosis not present

## 2022-04-19 LAB — CBC
HCT: 42.4 % (ref 36.0–46.0)
Hemoglobin: 13.8 g/dL (ref 12.0–15.0)
MCH: 29.7 pg (ref 26.0–34.0)
MCHC: 32.5 g/dL (ref 30.0–36.0)
MCV: 91.4 fL (ref 80.0–100.0)
Platelets: 211 10*3/uL (ref 150–400)
RBC: 4.64 MIL/uL (ref 3.87–5.11)
RDW: 13.1 % (ref 11.5–15.5)
WBC: 5.6 10*3/uL (ref 4.0–10.5)
nRBC: 0 % (ref 0.0–0.2)

## 2022-04-19 LAB — I-STAT CHEM 8, ED
BUN: 21 mg/dL (ref 8–23)
Calcium, Ion: 1.18 mmol/L (ref 1.15–1.40)
Chloride: 102 mmol/L (ref 98–111)
Creatinine, Ser: 1.1 mg/dL — ABNORMAL HIGH (ref 0.44–1.00)
Glucose, Bld: 109 mg/dL — ABNORMAL HIGH (ref 70–99)
HCT: 43 % (ref 36.0–46.0)
Hemoglobin: 14.6 g/dL (ref 12.0–15.0)
Potassium: 4.5 mmol/L (ref 3.5–5.1)
Sodium: 136 mmol/L (ref 135–145)
TCO2: 23 mmol/L (ref 22–32)

## 2022-04-19 LAB — CBG MONITORING, ED: Glucose-Capillary: 104 mg/dL — ABNORMAL HIGH (ref 70–99)

## 2022-04-19 LAB — DIFFERENTIAL
Abs Immature Granulocytes: 0.01 10*3/uL (ref 0.00–0.07)
Basophils Absolute: 0 10*3/uL (ref 0.0–0.1)
Basophils Relative: 1 %
Eosinophils Absolute: 0.1 10*3/uL (ref 0.0–0.5)
Eosinophils Relative: 2 %
Immature Granulocytes: 0 %
Lymphocytes Relative: 29 %
Lymphs Abs: 1.6 10*3/uL (ref 0.7–4.0)
Monocytes Absolute: 0.6 10*3/uL (ref 0.1–1.0)
Monocytes Relative: 10 %
Neutro Abs: 3.3 10*3/uL (ref 1.7–7.7)
Neutrophils Relative %: 58 %

## 2022-04-19 LAB — URINALYSIS, ROUTINE W REFLEX MICROSCOPIC
Bilirubin Urine: NEGATIVE
Glucose, UA: NEGATIVE mg/dL
Hgb urine dipstick: NEGATIVE
Ketones, ur: NEGATIVE mg/dL
Leukocytes,Ua: NEGATIVE
Nitrite: NEGATIVE
Protein, ur: NEGATIVE mg/dL
Specific Gravity, Urine: 1.014 (ref 1.005–1.030)
pH: 7 (ref 5.0–8.0)

## 2022-04-19 LAB — COMPREHENSIVE METABOLIC PANEL
ALT: 17 U/L (ref 0–44)
AST: 28 U/L (ref 15–41)
Albumin: 4.2 g/dL (ref 3.5–5.0)
Alkaline Phosphatase: 78 U/L (ref 38–126)
Anion gap: 10 (ref 5–15)
BUN: 21 mg/dL (ref 8–23)
CO2: 22 mmol/L (ref 22–32)
Calcium: 9.1 mg/dL (ref 8.9–10.3)
Chloride: 99 mmol/L (ref 98–111)
Creatinine, Ser: 1.1 mg/dL — ABNORMAL HIGH (ref 0.44–1.00)
GFR, Estimated: 47 mL/min — ABNORMAL LOW (ref >60)
Glucose, Bld: 110 mg/dL — ABNORMAL HIGH (ref 70–99)
Potassium: 4.2 mmol/L (ref 3.5–5.1)
Sodium: 131 mmol/L — ABNORMAL LOW (ref 135–145)
Total Bilirubin: 0.8 mg/dL (ref 0.3–1.2)
Total Protein: 7.7 g/dL (ref 6.5–8.1)

## 2022-04-19 LAB — RAPID URINE DRUG SCREEN, HOSP PERFORMED
Amphetamines: NOT DETECTED
Barbiturates: NOT DETECTED
Benzodiazepines: NOT DETECTED
Cocaine: NOT DETECTED
Opiates: NOT DETECTED
Tetrahydrocannabinol: NOT DETECTED

## 2022-04-19 LAB — PROTIME-INR
INR: 1.2 (ref 0.8–1.2)
Prothrombin Time: 14.8 seconds (ref 11.4–15.2)

## 2022-04-19 LAB — C-REACTIVE PROTEIN: CRP: <0.5 mg/dL (ref <1.0)

## 2022-04-19 LAB — APTT: aPTT: 45 seconds — ABNORMAL HIGH (ref 24–36)

## 2022-04-19 LAB — SEDIMENTATION RATE: Sed Rate: 21 mm/hr (ref 0–22)

## 2022-04-19 LAB — ETHANOL: Alcohol, Ethyl (B): <10 mg/dL (ref <10)

## 2022-04-19 MED ORDER — IOHEXOL 350 MG/ML SOLN
75.0000 mL | Freq: Once | INTRAVENOUS | Status: AC | PRN
  Administered 2022-04-19: 75 mL via INTRAVENOUS

## 2022-04-19 MED ORDER — MAGNESIUM SULFATE 2 GM/50ML IV SOLN
2.0000 g | Freq: Once | INTRAVENOUS | Status: AC
  Administered 2022-04-19: 2 g via INTRAVENOUS
  Filled 2022-04-19: qty 50

## 2022-04-19 MED ORDER — LABETALOL HCL 5 MG/ML IV SOLN
10.0000 mg | Freq: Once | INTRAVENOUS | Status: AC
  Administered 2022-04-19: 10 mg via INTRAVENOUS
  Filled 2022-04-19: qty 4

## 2022-04-19 NOTE — Progress Notes (Signed)
EMS pre elert 1426  Page to Dr. Rory Percy for pre elert [phone] Page to Dr. Rory Percy pt arrived now 45 Dr. Rory Percy on screen 1437 Pt to CT 1443  Slurred speech, off balance, headache, R leg weakness Nothing further needed from Telestroke per Dr. Rory Percy at [phone]

## 2022-04-19 NOTE — ED Triage Notes (Signed)
Pt arrived [facility] for slurred speech that started at 1300 hrs. Pt was at home with daughter. Pt was sitting in her chair and noticed pt started slurred speech and thought she had a facial droop. EMS stated they heard slurred speech but did not see a droop. Pt verbalized she had a headache around 7am but was normal. Daughter stated pt gait was a little off at 1300.

## 2022-04-19 NOTE — Discharge Instructions (Signed)
Your workup today was overall reassuring.  You were evaluated by the neurologist who feels you are appropriate for discharge to follow-up with neurologist outpatient.  If you have recurrent symptoms please return to the emergency department.  If you have any concerning symptoms return to the emergency department.  I recommend you keep a blood pressure diary and follow-up with your primary care provider to see if any adjustments need to be made to your blood pressure medication.  I have given you a referral to neurologist.  You can call their office Monday to schedule this appointment they should be reaching out to you in the next 2 business days.

## 2022-04-19 NOTE — ED Provider Notes (Signed)
Signout received on this 89 year old female who presented as a code stroke.  Woke up this morning with a headache.  Around 1 PM she developed right-sided weakness and facial droop.  This started to improve on her way to the emergency department.  She was evaluated by neurology.  CT head, CT angio head and neck without acute concerns.  At the time of signout patient is awaiting chest x-ray, and CRP and discussion with neurology. Physical Exam  BP (!) 186/71   Pulse 64   Temp 99.1 F (37.3 C)   Resp 19   Ht '5\' 4"'$  (1.626 m)   Wt 62.6 kg   SpO2 96%   BMI 23.69 kg/m        Procedures  Procedures  ED Course / MDM   Clinical Course as of 04/19/22 2006  Sat Apr 19, 2022  1954 Sed Rate: 21 [AA]    Clinical Course User Index [AA] Evlyn Courier, PA-C   Medical Decision Making Amount and/or Complexity of Data Reviewed Labs: ordered. Decision-making details documented in ED Course. Radiology: ordered.  Risk Prescription drug management.   Discussed with neurologist Dr. Rory Percy who recommends no additional workup in the ED and recommends that patient follow-up outpatient with neurology.  Will provide neurology follow-up information.  Chest x-ray results did not crossover into the system.  However they were read by a radiologist.  Paper copies were provided to me.  Have attached these to patient's chart.  CRP has not resulted.  I have attempted to contact lab on multiple occasions with no answer.  The signout provider also reached out to them on multiple occasions without an answer.  Discussed with patient and daughter the plan following discharge.  They are in agreement.       Evlyn Courier, PA-C 04/19/22 2016    Isla Pence, MD 04/20/22 [phone]

## 2022-04-19 NOTE — Progress Notes (Signed)
Code stroke time documentation  [id number] Call time 1446 Exam started 1452 Exam finished 1455 Exam completed in [place] radiology called

## 2022-04-19 NOTE — ED Provider Notes (Signed)
[facility] Provider Note   CSN: [id number] Arrival date & time: 04/19/22  1436     History  Chief Complaint  Patient presents with   Code Stroke    Dana [name] is a 89 y.o. female, history of TIA, CVA, who presents to the ED secondary to a severe headache that is been going on since noon, and then around 1300 started having difficulty with speech, slurred speech, per daughter, and then had a left facial droop.  She states that the droop improved, but the speech was persistent, until EMS arrived.  She states that she had an episode like this last week, and was taken to [facility], and the exam was unremarkable.  Has been compliant with her medications.  Denies any nausea, vomiting, confusion.  States that she has pain behind her left eye, and that her left temple hurts.  Home Medications Prior to Admission medications   Medication Sig Start Date End Date Taking? Authorizing Provider  ALPRAZolam (XANAX) 0.25 MG tablet Take 0.25 mg by mouth 2 (two) times daily as needed. 11/03/19  Yes [provider]  dorzolamide-timolol (COSOPT) 2-0.5 % ophthalmic solution Place 1 drop into the left eye 2 (two) times daily.   Yes [provider]  ELIQUIS 5 MG TABS tablet Take 5 mg by mouth 2 (two) times daily.   Yes [provider]  metoprolol succinate (TOPROL-XL) 25 MG 24 hr tablet Take 12.5 mg by mouth daily. If tolerating well   Yes [provider]  Multiple Vitamin (MULTI-VITAMIN) tablet Take 1 tablet by mouth daily.   Yes [provider]  omeprazole (PRILOSEC) 40 MG capsule Take 40 mg by mouth daily.   Yes [provider]  simvastatin (ZOCOR) 20 MG tablet Take 20 mg by mouth every evening.   Yes [provider]  VYZULTA 0.024 % SOLN Place 1 drop into both eyes at bedtime. 04/04/22  Yes [provider]  propranolol (INDERAL) 60 MG tablet Take 60 mg by mouth 3 (three) times  daily. Patient not taking: Reported on 04/19/2022    [provider]  vitamin B-12 (CYANOCOBALAMIN) 1000 MCG tablet Take 1,000 mcg by mouth daily. Patient not taking: Reported on 04/19/2022    [provider]      Allergies    Gabapentin and Paxil [paroxetine hcl]    Review of Systems   Review of Systems  Neurological:  Positive for facial asymmetry, speech difficulty and headaches. Negative for dizziness.    Physical Exam Updated Vital Signs BP (!) 166/56   Pulse (!) 53   Temp 99.1 F (37.3 C)   Resp 20   Ht '5\' 4"'$  (1.626 m)   Wt 62.6 kg   SpO2 90%   BMI 23.69 kg/m  Physical Exam Vitals and nursing note reviewed.  Constitutional:      General: She is not in acute distress.    Appearance: She is well-developed.  HENT:     Head: Normocephalic and atraumatic.  Eyes:     Conjunctiva/sclera: Conjunctivae normal.  Cardiovascular:     Rate and Rhythm: Normal rate and regular rhythm.     Heart sounds: No murmur heard. Pulmonary:     Effort: Pulmonary effort is normal. No respiratory distress.     Breath sounds: Normal breath sounds.  Abdominal:     Palpations: Abdomen is soft.     Tenderness: There is no abdominal tenderness.  Musculoskeletal:  General: No swelling.     Cervical back: Neck supple.  Skin:    General: Skin is warm and dry.     Capillary Refill: Capillary refill takes less than 2 seconds.  Neurological:     Mental Status: She is alert and oriented to person, place, and time.     Cranial Nerves: No cranial nerve deficit.     Sensory: No sensory deficit.     Coordination: Coordination is intact.     Comments: No slurred speech. 5/5 strength of BUE and LLE. +4/5 RLE weakness. No cranial deficits. Sensation intact. No tremor. No facial droop.   Psychiatric:        Mood and Affect: Mood normal.     ED Results / Procedures / Treatments   Labs (all labs ordered are listed, but only abnormal results are displayed) Labs Reviewed   APTT - Abnormal; Notable for the following components:      Result Value   aPTT 45 (*)    All other components within normal limits  COMPREHENSIVE METABOLIC PANEL - Abnormal; Notable for the following components:   Sodium 131 (*)    Glucose, Bld 110 (*)    Creatinine, Ser 1.10 (*)    GFR, Estimated 47 (*)    All other components within normal limits  URINALYSIS, ROUTINE W REFLEX MICROSCOPIC - Abnormal; Notable for the following components:   Color, Urine STRAW (*)    All other components within normal limits  CBG MONITORING, ED - Abnormal; Notable for the following components:   Glucose-Capillary 104 (*)    All other components within normal limits  I-STAT CHEM 8, ED - Abnormal; Notable for the following components:   Creatinine, Ser 1.10 (*)    Glucose, Bld 109 (*)    All other components within normal limits  ETHANOL  PROTIME-INR  CBC  DIFFERENTIAL  RAPID URINE DRUG SCREEN, HOSP PERFORMED  SEDIMENTATION RATE  C-REACTIVE PROTEIN    EKG None  Radiology CT HEAD CODE STROKE WO CONTRAST  Result Date: 04/19/2022 CLINICAL DATA:  Code stroke.  Headache.  Slurred speech. EXAM: CT HEAD WITHOUT CONTRAST TECHNIQUE: Contiguous axial images were obtained from the base of the skull through the vertex without intravenous contrast. RADIATION DOSE REDUCTION: This exam was performed according to the departmental dose-optimization program which includes automated exposure control, adjustment of the mA and/or kV according to patient size and/or use of iterative reconstruction technique. COMPARISON:  MRI Head 09/06/13 FINDINGS: Brain: No evidence of acute infarction, hemorrhage, hydrocephalus, extra-axial collection or mass lesion/mass effect. There is sequela of severe chronic microvascular ischemic change. Chronic infarct involving the medial right cerebellar hemisphere. Vascular: No hyperdense vessel or unexpected calcification. Skull: Normal. Negative for fracture or focal lesion. Sinuses/Orbits:  No middle ear or mastoid effusion. Paranasal sinuses are clear. Bilateral lens replacement. Orbits are unremarkable. Other: None ASPECTS ([place] Stroke Program Early CT Score): 10 IMPRESSION: 1. No hemorrhage or CT evidence of an acute infarct. Aspects is 10. 2. Sequela of severe chronic microvascular ischemic change. Chronic infarct involving the medial right cerebellar hemisphere. Electronically Signed   By: Marin Roberts M.D.   On: 04/19/2022 15:02    Procedures Procedures    Medications Ordered in ED Medications  iohexol (OMNIPAQUE) 350 MG/ML injection 75 mL (75 mLs Intravenous Contrast Given 04/19/22 1457)  labetalol (NORMODYNE) injection 10 mg (10 mg Intravenous Given 04/19/22 1556)  magnesium sulfate IVPB 2 g 50 mL (0 g Intravenous Stopped 04/19/22 1815)    ED Course/ Medical  Decision Making/ A&P                             Medical Decision Making Patient is a 89 year old female, here for episode of slurred speech that occurred around 1300.  Code stroke called, neuro following, at bedside, evaluated sent for CT head, CTA, patient is not a tPA candidate, Dr. Rory Percy recommends CRP ESR chest x-ray and urinalysis for further evaluation.  He request that I call him once the results have returned, to further help with disposition.  Patient's daughter states a similar episode like this happen, and she was seen at [facility] with an unremarkable echocardiogram MRI, CTA, and CT.  Patient is notably hypertensive on exam we will give her a dose of hydralazine, as well as magnesium for her headache.  Monitor for relief.  Amount and/or Complexity of Data Reviewed Labs: ordered.    Details: No acute findings, sed rate within normal limits, CRP pending. Radiology: ordered.    Details: CT head, CTA head neck unremarkable.  Chest x-ray pending. ECG/medicine tests:  Decision-making details documented in ED Course. Discussion of management or test interpretation with external provider(s): Headache improved  after relief of blood pressure, and resolved by use of magnesium.  CRP pending, chest x-ray pending at handover.  Handed over to [facility], he will follow-up on this, and reconsult Dr. Rory Percy for further disposition..  Risk Prescription drug management.  CRITICAL CARE Performed by: Osvaldo Shipper   Total critical care time: 30 minutes  Critical care time was exclusive of separately billable procedures and treating other patients.  Critical care was necessary to treat or prevent imminent or life-threatening deterioration.  Critical care was time spent personally by me on the following activities: development of treatment plan with patient and/or surrogate as well as nursing, discussions with consultants, evaluation of patient's response to treatment, examination of patient, obtaining history from patient or surrogate, ordering and performing treatments and interventions, ordering and review of laboratory studies, ordering and review of radiographic studies, pulse oximetry and re-evaluation of patient's condition.  Final Clinical Impression(s) / ED Diagnoses Final diagnoses:  Slurred speech  Weakness of right lower extremity    Rx / DC Orders ED Discharge Orders     None         Diamantina Monks, Si Gaul, PA 04/19/22 Willow Ora, MD 04/22/22 1428

## 2022-04-19 NOTE — Consult Note (Addendum)
[facility] Telemedicine Consult   Requesting Provider: Dr. Roderic Palau Consult Participants: Dr. Jerelyn Charles, Telespecialist RN [place]   bedside RN [place] Location of the [place] Location of the patient: [place]  This consult was provided via telemedicine with 2-way video and audio communication. The patient/family was informed that care would be provided in this way and agreed to receive care in this manner.   Chief Complaint: Headache, slurred speech  HPI: 89 year old with past history of chronic kidney disease 3, headache, hypertension, peripheral vascular disease, prior TIAs with no residual deficits, anxiety, presented to the emergency room for evaluation of sudden onset of slurred speech witnessed by the daughter at 1:30 PM.  Daughter reports that at noon she started complaining of a headache, throbbing kind behind the left eye, which was very severe, she took Tylenol, not much relief of the headache and about 130 she started noticing that her mother was slurring her words.  Daughter also reported possible left lower facial weakness. Similar episode happened last week and she was seen at [facility] and had a whole TIA workup including a CT, CT angiogram, MRI and echocardiogram all of which were unremarkable. She has atrial fibrillation-on Eliquis and it was continued. She continues to take Eliquis Denies any history of jaw claudication.  Denies any history of visual symptoms or episodes of visual loss.    Past Medical History:  Diagnosis Date   Anxiety state, unspecified    Chronic kidney disease, stage III (moderate) ([place])    Encounter for long-term (current) use of other medications    Headache(784.0)    HTN (hypertension)    Other and unspecified hyperlipidemia    Peripheral vascular disease, unspecified ([place])    Pure hypercholesterolemia    Transient ischemic attack (TIA), and cerebral infarction without residual deficits(V12.54)    Type II or  unspecified type diabetes mellitus without mention of complication, not stated as uncontrolled    Unspecified hypertensive kidney disease with chronic kidney disease stage I through stage IV, or unspecified(403.90)    Unspecified transient cerebral ischemia     No current facility-administered medications for this encounter.  Current Outpatient Medications:    ALPRAZolam (XANAX) 0.25 MG tablet, Take 0.25 mg by mouth 2 (two) times daily as needed., Disp: , Rfl:    aspirin EC 81 MG tablet, Take 1 tablet (81 mg total) by mouth daily. Swallow whole., Disp: 30 tablet, Rfl: 11   omeprazole (PRILOSEC) 40 MG capsule, Take 40 mg by mouth daily., Disp: , Rfl:    propranolol (INDERAL) 60 MG tablet, Take 60 mg by mouth 3 (three) times daily., Disp: , Rfl:    simvastatin (ZOCOR) 20 MG tablet, Take 20 mg by mouth every evening., Disp: , Rfl:    vitamin B-12 (CYANOCOBALAMIN) 1000 MCG tablet, Take 1,000 mcg by mouth daily., Disp: , Rfl:     LKW: 12 PM TNK given-no, on Eliquis IR Thrombectomy? No, no VL Modified Rankin Scale: 2-Slight disability-UNABLE to perform all activities but does not need assistance Time of teleneurologist evaluation: 1437 hrs.  Exam: Vitals:   04/19/22 1436  BP: (!) 187/101  Pulse: 61  Resp: 16  Temp: 99.1 F (37.3 C)  SpO2: 99%    General: Awake alert in no distress Neurological exam Awake alert oriented x 3.  Speech is mildly slow but not truly dysarthric.  No aphasia.  Cranials 2-12 intact, motor exam with right lower extremity drift.  Otherwise intact. sensory exam with no deficits, coordination intact.   NIHSS  1A: Level of Consciousness - 0 1B: Ask Month and Age - 0 1C: 'Blink Eyes' & 'Squeeze Hands' - 0 2: Test Horizontal Extraocular Movements - 0 3: Test Visual Fields - 0 4: Test Facial Palsy - 0 5A: Test Left Arm Motor Drift - 0 5B: Test Right Arm Motor Drift - 0 6A: Test Left Leg Motor Drift - 0 6B: Test Right Leg Motor Drift - 1 7: Test Limb Ataxia  - 0 8: Test Sensation - 0 9: Test Language/Aphasia- 0 10: Test Dysarthria - 0 11: Test Extinction/Inattention - 0 NIHSS score: 1   Imaging Reviewed: CT head aspects 10.  CT angio no emergent LVO.  Labs reviewed in epic and pertinent values follow: CBC    Component Value Date/Time   WBC 5.6 04/19/2022 1445   RBC 4.64 04/19/2022 1445   HGB 14.6 04/19/2022 1445   HGB 13.8 04/19/2022 1445   HCT 43.0 04/19/2022 1445   HCT 42.4 04/19/2022 1445   PLT 211 04/19/2022 1445   MCV 91.4 04/19/2022 1445   MCH 29.7 04/19/2022 1445   MCHC 32.5 04/19/2022 1445   RDW 13.1 04/19/2022 1445   LYMPHSABS 1.6 04/19/2022 1445   MONOABS 0.6 04/19/2022 1445   EOSABS 0.1 04/19/2022 1445   BASOSABS 0.0 04/19/2022 1445   CMP     Component Value Date/Time   NA 136 04/19/2022 1445   NA 131 (L) 04/19/2022 1445   K 4.5 04/19/2022 1445   K 4.2 04/19/2022 1445   CL 102 04/19/2022 1445   CL 99 04/19/2022 1445   CO2 22 04/19/2022 1445   GLUCOSE 109 (H) 04/19/2022 1445   GLUCOSE 110 (H) 04/19/2022 1445   BUN 21 04/19/2022 1445   BUN 21 04/19/2022 1445   CREATININE 1.10 (H) 04/19/2022 1445   CREATININE 1.10 (H) 04/19/2022 1445   CALCIUM 9.1 04/19/2022 1445   PROT 7.7 04/19/2022 1445   ALBUMIN 4.2 04/19/2022 1445   AST 28 04/19/2022 1445   ALT 17 04/19/2022 1445   ALKPHOS 78 04/19/2022 1445   BILITOT 0.8 04/19/2022 1445   GFRNONAA 47 (L) 04/19/2022 1445     Assessment: 89 year old woman with history of atrial fibrillation on Eliquis, amongst other comorbidities, history of headaches, recent hospitalization for headache followed by slurred speech-received a full TIA workup at [facility] with unremarkable MRI, CT, CTA and echocardiogram, presenting again with episode of slurred speech after a headache that she described as headache behind her left eye, with no visual loss, no prior history of jaw claudication. EMS noted some slurred speech with improvement as they were bringing her into the  hospital. On my examination, she is just slow to respond to questions but does not truly have any dysarthria I suspect that this is likely related to a possible complex migraine versus she also has glaucoma in her left eye according to the daughter-the headache might be related to that but given her age, I will check for any evidence of GCA. I do not think she needs an admission for stroke/TIA workup but she needs this headache situation sorted out for proper triage to the outpatient practice   IMPRESSION Complex migraine R/O GCA   Recommendations:  MRI facility is not available and exam nonfocal hence no need to transfer to [facility] for MRI. Check ESR and CRP Check UA chest x-ray Unless the ESR and CRP are significantly elevated, can follow-up with outpatient neurology-Dr. Lavell Anchors at [facility] neurolog[facility]. Continue Eliquis Plan discussed with Dr. Roderic Palau Plan also  discussed with patient's daughter at bedside Please call with questions  -- Amie Portland, MD Neurologist [facility] Pager: [phone]

## 2022-04-19 NOTE — ED Notes (Signed)
Per daughter at bedside, pt's headache started at 1200. Pt began to have difficulty ambulating and slurred speech at 1300.

## 2022-04-23 DIAGNOSIS — I10 Essential (primary) hypertension: Secondary | ICD-10-CM | POA: Diagnosis not present

## 2022-04-25 ENCOUNTER — Telehealth: Payer: Self-pay | Admitting: *Deleted

## 2022-04-25 DIAGNOSIS — Z299 Encounter for prophylactic measures, unspecified: Secondary | ICD-10-CM | POA: Diagnosis not present

## 2022-04-25 DIAGNOSIS — G459 Transient cerebral ischemic attack, unspecified: Secondary | ICD-10-CM | POA: Diagnosis not present

## 2022-04-25 DIAGNOSIS — I10 Essential (primary) hypertension: Secondary | ICD-10-CM | POA: Diagnosis not present

## 2022-04-25 DIAGNOSIS — G43109 Migraine with aura, not intractable, without status migrainosus: Secondary | ICD-10-CM | POA: Diagnosis not present

## 2022-04-25 DIAGNOSIS — Z09 Encounter for follow-up examination after completed treatment for conditions other than malignant neoplasm: Secondary | ICD-10-CM | POA: Diagnosis not present

## 2022-04-25 NOTE — Telephone Encounter (Signed)
     Patient  visit on 04/19/2022  at [facility] was for treatment   Have you been able to follow up with your primary care physician? yes  The patient was able to obtain any needed medicine or equipment.  Are there diet recommendations that you are having difficulty following?  Patient expresses understanding of discharge instructions and education provided has no other needs at this time.    [place] [phone] [address] , [place] [address] Email : [email or web address]

## 2022-04-29 ENCOUNTER — Ambulatory Visit: Payer: Medicare Other | Admitting: Neurology

## 2022-04-29 ENCOUNTER — Encounter: Payer: Self-pay | Admitting: Neurology

## 2022-04-29 VITALS — BP 174/68 | HR 65 | Ht 64.0 in | Wt 131.5 lb

## 2022-04-29 DIAGNOSIS — G43009 Migraine without aura, not intractable, without status migrainosus: Secondary | ICD-10-CM | POA: Diagnosis not present

## 2022-04-29 DIAGNOSIS — G459 Transient cerebral ischemic attack, unspecified: Secondary | ICD-10-CM | POA: Diagnosis not present

## 2022-04-29 DIAGNOSIS — I480 Paroxysmal atrial fibrillation: Secondary | ICD-10-CM | POA: Diagnosis not present

## 2022-04-29 NOTE — Patient Instructions (Signed)
I had a long discussion with the patient and her daughter regarding her history of paroxysmal A-fib and TIA.  Recommend to continue Eliquis for stroke prevention maintain aggressive risk factor modification with strict control of hypertension with blood pressure goal below 140/80, lipids with LDL cholesterol goal below 70 mg diabetes with hemoglobin A1c goal below 6.5 patient may also consider possible participation in the [place] AF study (eliquis compared to prior.  11 inhibitor Milvexian ) if interested and will be given information to review at home and decide.  Return for follow-up in the future in 6 months or call earlier if necessary.

## 2022-04-29 NOTE — Progress Notes (Signed)
[facility] [address] [place]. [place] [address] [phone]       OFFICE FOLLOW-UP VISIT NOTE  Ms. LEZLY TAWNEY Date of Birth:  Sep 12, 1927 Medical Record Number:  [id number]   Referring MD: Jerene Bears Reason for Referral: TIA  HPI: Initial visit 11/14/2019 Ms. Chiaverini is a pleasant 89 year old Caucasian lady seen today for initial office consultation visit for TIA.  She is accompanied by her daughter today.  History is obtained from them and review of referral notes and electronic medical records.  Unfortunately imaging films are not available for personal review today but reports are.  She has past medical history of hypertension, hyperlipidemia who presented to the [facility] on 10/29/2019 with recurrent episodes of transient left upper extremity numbness along with facial droop and confusion.  She had 3 such episodes lasting barely 5 minutes or so.  She denied any actual weakness in the hand but states it was only numb.  The episode resolved at time she called 911 and then occurred again in the ambulance.  By the time she reached the hospital she was completely resolved.  The daughter was eyewitness to 1 of these episode and noticed some confusion and facial droop as well along with left arm numbness.  The patient denied any accompanying headache, blurred vision, vertigo, double vision or lower extremity weakness or numbness.  A week prior interestingly she had had a transient episode of right upper extremity numbness at that time EMS were called and her blood pressure was found to be significantly elevated at 198/98.  She was taken to emergency room and by the time she reached the symptoms resolved.  This time a CT scan of the head was done alone which was negative.  During the more recent admission with recurrent TIA she was admitted and underwent an MRI scan of the brain which apparently showed no acute abnormality though I do not have the films to look at.   She apparently had carotid ultrasound which as per the daughter showed some moderate 50% stenosis on the left but I do not have the actual report to look at.  We will lipid profile showed LDL cholesterol of 61 and she is on a statin.  She has remote history of TIAs 6 years ago as well and was placed on Aggrenox and done well.  The recent years of brand-name Aggrenox was switched to a generic form.  She has no known prior history of seizures, loss of consciousness, head injury. Update 04/29/2022 : Patient is referred back to see me today after 2 and half years because of new episode of possible TIA versus complicated migraine.  She is accompanied by her daughter.  History is obtained from them and review of electronic medical records and I personally reviewed pertinent available imaging films in PACS.  She presented on 04/15/2022 to [facility], ER with sudden onset of slurred speech and also headache behind the left eye.  Daughter also noticed some left lower facial weakness.  Episode was transient and resolved in the ER.  CT angiogram of the brain and neck showed no large vessel stenosis or occlusion and CT scan showed no acute findings.  She had a somewhat similar episode a week ago when she had gone to [facility] at that time also she had some slurred speech with right facial droop as per the daughter.  Details of the hospital imaging studies are not available for personal review but as per daughter MRI scan was  negative.  Patient has chronic atrial fibrillation and is on Eliquis which has been continued.  He is tolerating it well without bleeding or bruising.  The patient denies any jaw claudication, transient vision loss or scalp tenderness.  He has done well since then and last 2 weeks as he has not had any recurrent episodes of TIA or headaches.  Patient has glaucoma and the daughter wonders whether this may be related to that.  She does have an upcoming appointment to see a glaucoma specialist in 2 weeks.   Patient was last seen by me 2 and half years ago for episodes of TIA which I felt were due to small vessel disease.  He was initially on Aggrenox but subsequently was found to have A-fib and switch to Eliquis which she seems to be tolerating well.  Lab work on 04/12/2022 showed LDL cholesterol to be optimal at 54 mg percent and hemoglobin A1c 5.8. ROS:   14 system review of systems is positive for slurred speech, headache numbness, tingling, confusion, memory loss, facial droop all other systems negative  PMH:  Past Medical History:  Diagnosis Date   Anxiety state, unspecified    Chronic kidney disease, stage III (moderate) ([place])    Encounter for long-term (current) use of other medications    Headache(784.0)    HTN (hypertension)    Other and unspecified hyperlipidemia    Peripheral vascular disease, unspecified ([place])    Pure hypercholesterolemia    Transient ischemic attack (TIA), and cerebral infarction without residual deficits(V12.54)    Type II or unspecified type diabetes mellitus without mention of complication, not stated as uncontrolled    Unspecified hypertensive kidney disease with chronic kidney disease stage I through stage IV, or unspecified(403.90)    Unspecified transient cerebral ischemia     Social History:  Social History   Socioeconomic History   Marital status: Widowed    Spouse name: Not on file   Number of children: Not on file   Years of education: Not on file   Highest education level: Not on file  Occupational History   Not on file  Tobacco Use   Smoking status: Never   Smokeless tobacco: Never  Substance and Sexual Activity   Alcohol use: No    Alcohol/week: 0.0 standard drinks of alcohol   Drug use: No   Sexual activity: Not on file  Other Topics Concern   Not on file  Social History Narrative   Not on file   Social Determinants of Health   Financial Resource Strain: Not on file  Food Insecurity: Not on file  Transportation Needs: Not on  file  Physical Activity: Not on file  Stress: Not on file  Social Connections: Not on file  Intimate Partner Violence: Not on file    Medications:   Current Outpatient Medications on File Prior to Visit  Medication Sig Dispense Refill   ALPRAZolam (XANAX) 0.25 MG tablet Take 0.25 mg by mouth 2 (two) times daily as needed.     dorzolamide-timolol (COSOPT) 2-0.5 % ophthalmic solution Place 1 drop into the left eye 2 (two) times daily.     ELIQUIS 5 MG TABS tablet Take 5 mg by mouth 2 (two) times daily.     metoprolol succinate (TOPROL-XL) 25 MG 24 hr tablet Take 12.5 mg by mouth daily. If tolerating well     Multiple Vitamin (MULTI-VITAMIN) tablet Take 1 tablet by mouth daily.     omeprazole (PRILOSEC) 40 MG capsule Take 40 mg by  mouth daily.     simvastatin (ZOCOR) 20 MG tablet Take 20 mg by mouth every evening.     vitamin B-12 (CYANOCOBALAMIN) 1000 MCG tablet Take 1,000 mcg by mouth daily.     VYZULTA 0.024 % SOLN Place 1 drop into both eyes at bedtime.     propranolol (INDERAL) 60 MG tablet Take 60 mg by mouth 3 (three) times daily. (Patient not taking: Reported on 04/29/2022)     No current facility-administered medications on file prior to visit.    Allergies:   Allergies  Allergen Reactions   Gabapentin Other (See Comments)     Per patient "it made me drunk as a skunk"   Paxil [Paroxetine Hcl] Other (See Comments)    Made her feel very tired    Physical Exam General: well developed, well nourished pleasant elderly Caucasian lady, seated, in no evident distress Head: head normocephalic and atraumatic.   Neck: supple with no carotid or supraclavicular bruits Cardiovascular: regular rate and rhythm, no murmurs Musculoskeletal: Mild kyphosis. Skin:  no rash/petichiae Vascular:  Normal pulses all extremities  Neurologic Exam Mental Status: Awake and fully alert. Oriented to place and time. Recent and remote memory intact. Attention span, concentration and fund of knowledge  appropriate. Mood and affect appropriate.  Mini-Mental status exam not done.  Normal speech and language.  No aphasia apraxia or dysarthria. Cranial Nerves: Fundoscopic exam not done. Pupils equal, briskly reactive to light. Extraocular movements full without nystagmus. Visual fields full to confrontation. Hearing diminished bilaterally. Facial sensation intact. Face, tongue, palate moves normally and symmetrically.  Motor: Normal bulk and tone. Normal strength in all tested extremity muscles. Sensory.: intact to touch , pinprick , position and vibratory sensation.  Coordination: Rapid alternating movements normal in all extremities. Finger-to-nose and heel-to-shin performed accurately bilaterally. Gait and Station: Arises from chair without difficulty. Stance is normal.  Uses a cane.  Gait demonstrates mild slow posture and slight imbalance when standing on a narrow base in either foot unsupported.  Not able to heel, toe and tandem walk  .  Reflexes: 1+ and symmetric. Toes downgoing.   NIHSS  0 Modified Rankin  1   ASSESSMENT: 89 year old pleasant Caucasian lady with recurrent transient episodes of left upper extremity numbness along with some facial droop and confusion possibly right brain subcortical TIAs likely from small vessel disease in September 2021.  Vascular risk factors of hypertension, hyperlipidemia , atrial fibrillation and age.  Prior episode of transient right upper extremity numbness a week ago possibly a left brain TIA from small vessel disease as well.  2 recent episodes of slurred speech and facial droop also possible TIA.  Doubt complicated migraine.     PLAN: I had a long discussion with the patient and her daughter regarding her history of paroxysmal A-fib and TIA.  Recommend to continue Eliquis for stroke prevention maintain aggressive risk factor modification with strict control of hypertension with blood pressure goal below 140/80, lipids with LDL cholesterol goal below  70 mg diabetes with hemoglobin A1c goal below 6.5 patient may also consider possible participation in the [place] AF study (eliquis compared to prior.  11 inhibitor Milvexian ) if interested and will be given information to review at home and decide.  Return for follow-up in the future in 6 months or call earlier if necessary.Greater than 50% time during this 40-minute prolonged visit was   spent on counseling and coordination of care about TIAs and discussion about stroke prevention and treatment and answering questions.  Followup in the future with me in 3 months or call earlier if necessary. Antony Contras, MD Note: This document was prepared with digital dictation and possible smart phrase technology. Any transcriptional errors that result from this process are unintentional.

## 2022-05-08 DIAGNOSIS — I10 Essential (primary) hypertension: Secondary | ICD-10-CM | POA: Diagnosis not present

## 2022-05-08 DIAGNOSIS — E7800 Pure hypercholesterolemia, unspecified: Secondary | ICD-10-CM | POA: Diagnosis not present

## 2022-05-08 DIAGNOSIS — I480 Paroxysmal atrial fibrillation: Secondary | ICD-10-CM | POA: Diagnosis not present

## 2022-05-16 DIAGNOSIS — H40051 Ocular hypertension, right eye: Secondary | ICD-10-CM | POA: Diagnosis not present

## 2022-05-16 DIAGNOSIS — H401123 Primary open-angle glaucoma, left eye, severe stage: Secondary | ICD-10-CM | POA: Diagnosis not present

## 2022-05-24 DIAGNOSIS — I10 Essential (primary) hypertension: Secondary | ICD-10-CM | POA: Diagnosis not present

## 2022-05-26 DIAGNOSIS — Z7189 Other specified counseling: Secondary | ICD-10-CM | POA: Diagnosis not present

## 2022-05-26 DIAGNOSIS — Z299 Encounter for prophylactic measures, unspecified: Secondary | ICD-10-CM | POA: Diagnosis not present

## 2022-05-26 DIAGNOSIS — I4891 Unspecified atrial fibrillation: Secondary | ICD-10-CM | POA: Diagnosis not present

## 2022-05-26 DIAGNOSIS — I10 Essential (primary) hypertension: Secondary | ICD-10-CM | POA: Diagnosis not present

## 2022-05-26 DIAGNOSIS — Z0000 Encounter for general adult medical examination without abnormal findings: Secondary | ICD-10-CM | POA: Diagnosis not present

## 2022-05-26 DIAGNOSIS — I779 Disorder of arteries and arterioles, unspecified: Secondary | ICD-10-CM | POA: Diagnosis not present

## 2022-05-26 DIAGNOSIS — D6869 Other thrombophilia: Secondary | ICD-10-CM | POA: Diagnosis not present

## 2022-05-26 DIAGNOSIS — R413 Other amnesia: Secondary | ICD-10-CM | POA: Diagnosis not present

## 2022-06-04 DIAGNOSIS — G4730 Sleep apnea, unspecified: Secondary | ICD-10-CM | POA: Diagnosis not present

## 2022-06-24 DIAGNOSIS — I10 Essential (primary) hypertension: Secondary | ICD-10-CM | POA: Diagnosis not present

## 2022-06-26 DIAGNOSIS — I700 Atherosclerosis of aorta: Secondary | ICD-10-CM | POA: Diagnosis not present

## 2022-06-26 DIAGNOSIS — I10 Essential (primary) hypertension: Secondary | ICD-10-CM | POA: Diagnosis not present

## 2022-06-26 DIAGNOSIS — R413 Other amnesia: Secondary | ICD-10-CM | POA: Diagnosis not present

## 2022-06-26 DIAGNOSIS — Z299 Encounter for prophylactic measures, unspecified: Secondary | ICD-10-CM | POA: Diagnosis not present

## 2022-06-26 DIAGNOSIS — I4891 Unspecified atrial fibrillation: Secondary | ICD-10-CM | POA: Diagnosis not present

## 2022-07-14 DIAGNOSIS — R5383 Other fatigue: Secondary | ICD-10-CM | POA: Diagnosis not present

## 2022-07-14 DIAGNOSIS — Z79899 Other long term (current) drug therapy: Secondary | ICD-10-CM | POA: Diagnosis not present

## 2022-07-14 DIAGNOSIS — E7800 Pure hypercholesterolemia, unspecified: Secondary | ICD-10-CM | POA: Diagnosis not present

## 2022-07-14 DIAGNOSIS — Z0000 Encounter for general adult medical examination without abnormal findings: Secondary | ICD-10-CM | POA: Diagnosis not present

## 2022-07-14 DIAGNOSIS — I10 Essential (primary) hypertension: Secondary | ICD-10-CM | POA: Diagnosis not present

## 2022-07-14 DIAGNOSIS — Z299 Encounter for prophylactic measures, unspecified: Secondary | ICD-10-CM | POA: Diagnosis not present

## 2022-07-25 DIAGNOSIS — I10 Essential (primary) hypertension: Secondary | ICD-10-CM | POA: Diagnosis not present

## 2022-08-24 DIAGNOSIS — I10 Essential (primary) hypertension: Secondary | ICD-10-CM | POA: Diagnosis not present

## 2022-09-12 DIAGNOSIS — H401123 Primary open-angle glaucoma, left eye, severe stage: Secondary | ICD-10-CM | POA: Diagnosis not present

## 2022-09-24 DIAGNOSIS — I10 Essential (primary) hypertension: Secondary | ICD-10-CM | POA: Diagnosis not present

## 2022-10-25 DIAGNOSIS — I10 Essential (primary) hypertension: Secondary | ICD-10-CM | POA: Diagnosis not present

## 2022-10-30 ENCOUNTER — Ambulatory Visit: Payer: Medicare Other | Admitting: Neurology

## 2022-10-30 ENCOUNTER — Telehealth: Payer: Self-pay | Admitting: Neurology

## 2022-10-30 NOTE — Telephone Encounter (Signed)
LVM, sent mychart msg informing pt of rs needed for today's appt- MD out.

## 2022-11-11 DIAGNOSIS — I2510 Atherosclerotic heart disease of native coronary artery without angina pectoris: Secondary | ICD-10-CM | POA: Diagnosis not present

## 2022-11-11 DIAGNOSIS — Z8673 Personal history of transient ischemic attack (TIA), and cerebral infarction without residual deficits: Secondary | ICD-10-CM | POA: Diagnosis not present

## 2022-11-11 DIAGNOSIS — I739 Peripheral vascular disease, unspecified: Secondary | ICD-10-CM | POA: Diagnosis not present

## 2022-11-11 DIAGNOSIS — I10 Essential (primary) hypertension: Secondary | ICD-10-CM | POA: Diagnosis not present

## 2022-11-11 DIAGNOSIS — I480 Paroxysmal atrial fibrillation: Secondary | ICD-10-CM | POA: Diagnosis not present

## 2022-11-24 DIAGNOSIS — I10 Essential (primary) hypertension: Secondary | ICD-10-CM | POA: Diagnosis not present

## 2022-12-24 DIAGNOSIS — I10 Essential (primary) hypertension: Secondary | ICD-10-CM | POA: Diagnosis not present

## 2023-01-06 DIAGNOSIS — H401123 Primary open-angle glaucoma, left eye, severe stage: Secondary | ICD-10-CM | POA: Diagnosis not present

## 2023-01-23 DIAGNOSIS — I10 Essential (primary) hypertension: Secondary | ICD-10-CM | POA: Diagnosis not present

## 2023-02-23 DIAGNOSIS — I10 Essential (primary) hypertension: Secondary | ICD-10-CM | POA: Diagnosis not present

## 2023-03-23 DIAGNOSIS — J449 Chronic obstructive pulmonary disease, unspecified: Secondary | ICD-10-CM | POA: Diagnosis not present

## 2023-03-23 DIAGNOSIS — Z299 Encounter for prophylactic measures, unspecified: Secondary | ICD-10-CM | POA: Diagnosis not present

## 2023-03-23 DIAGNOSIS — I4891 Unspecified atrial fibrillation: Secondary | ICD-10-CM | POA: Diagnosis not present

## 2023-03-23 DIAGNOSIS — D692 Other nonthrombocytopenic purpura: Secondary | ICD-10-CM | POA: Diagnosis not present

## 2023-03-23 DIAGNOSIS — I779 Disorder of arteries and arterioles, unspecified: Secondary | ICD-10-CM | POA: Diagnosis not present

## 2023-03-23 DIAGNOSIS — N1832 Chronic kidney disease, stage 3b: Secondary | ICD-10-CM | POA: Diagnosis not present

## 2023-03-25 DIAGNOSIS — I10 Essential (primary) hypertension: Secondary | ICD-10-CM | POA: Diagnosis not present

## 2023-03-26 DIAGNOSIS — L570 Actinic keratosis: Secondary | ICD-10-CM | POA: Diagnosis not present

## 2023-03-26 DIAGNOSIS — C44329 Squamous cell carcinoma of skin of other parts of face: Secondary | ICD-10-CM | POA: Diagnosis not present

## 2023-03-26 DIAGNOSIS — L0100 Impetigo, unspecified: Secondary | ICD-10-CM | POA: Diagnosis not present

## 2023-03-26 DIAGNOSIS — X32XXXD Exposure to sunlight, subsequent encounter: Secondary | ICD-10-CM | POA: Diagnosis not present

## 2023-04-24 DIAGNOSIS — I10 Essential (primary) hypertension: Secondary | ICD-10-CM | POA: Diagnosis not present

## 2023-04-30 DIAGNOSIS — Z08 Encounter for follow-up examination after completed treatment for malignant neoplasm: Secondary | ICD-10-CM | POA: Diagnosis not present

## 2023-04-30 DIAGNOSIS — Z85828 Personal history of other malignant neoplasm of skin: Secondary | ICD-10-CM | POA: Diagnosis not present

## 2023-05-14 DIAGNOSIS — I2510 Atherosclerotic heart disease of native coronary artery without angina pectoris: Secondary | ICD-10-CM | POA: Diagnosis not present

## 2023-05-14 DIAGNOSIS — I739 Peripheral vascular disease, unspecified: Secondary | ICD-10-CM | POA: Diagnosis not present

## 2023-05-14 DIAGNOSIS — Z8673 Personal history of transient ischemic attack (TIA), and cerebral infarction without residual deficits: Secondary | ICD-10-CM | POA: Diagnosis not present

## 2023-05-14 DIAGNOSIS — I480 Paroxysmal atrial fibrillation: Secondary | ICD-10-CM | POA: Diagnosis not present

## 2023-05-14 DIAGNOSIS — I10 Essential (primary) hypertension: Secondary | ICD-10-CM | POA: Diagnosis not present

## 2023-05-24 DIAGNOSIS — I10 Essential (primary) hypertension: Secondary | ICD-10-CM | POA: Diagnosis not present

## 2023-06-18 DIAGNOSIS — N1832 Chronic kidney disease, stage 3b: Secondary | ICD-10-CM | POA: Diagnosis not present

## 2023-06-18 DIAGNOSIS — M5136 Other intervertebral disc degeneration, lumbar region with discogenic back pain only: Secondary | ICD-10-CM | POA: Diagnosis not present

## 2023-06-18 DIAGNOSIS — M47814 Spondylosis without myelopathy or radiculopathy, thoracic region: Secondary | ICD-10-CM | POA: Diagnosis not present

## 2023-06-18 DIAGNOSIS — M549 Dorsalgia, unspecified: Secondary | ICD-10-CM | POA: Diagnosis not present

## 2023-06-18 DIAGNOSIS — M5459 Other low back pain: Secondary | ICD-10-CM | POA: Diagnosis not present

## 2023-06-18 DIAGNOSIS — M47816 Spondylosis without myelopathy or radiculopathy, lumbar region: Secondary | ICD-10-CM | POA: Diagnosis not present

## 2023-06-18 DIAGNOSIS — I4891 Unspecified atrial fibrillation: Secondary | ICD-10-CM | POA: Diagnosis not present

## 2023-06-18 DIAGNOSIS — I10 Essential (primary) hypertension: Secondary | ICD-10-CM | POA: Diagnosis not present

## 2023-06-18 DIAGNOSIS — M546 Pain in thoracic spine: Secondary | ICD-10-CM | POA: Diagnosis not present

## 2023-06-18 DIAGNOSIS — Z299 Encounter for prophylactic measures, unspecified: Secondary | ICD-10-CM | POA: Diagnosis not present

## 2023-06-18 DIAGNOSIS — I700 Atherosclerosis of aorta: Secondary | ICD-10-CM | POA: Diagnosis not present

## 2023-06-18 DIAGNOSIS — R52 Pain, unspecified: Secondary | ICD-10-CM | POA: Diagnosis not present

## 2023-06-18 DIAGNOSIS — M5134 Other intervertebral disc degeneration, thoracic region: Secondary | ICD-10-CM | POA: Diagnosis not present

## 2023-06-24 DIAGNOSIS — I10 Essential (primary) hypertension: Secondary | ICD-10-CM | POA: Diagnosis not present

## 2023-07-05 ENCOUNTER — Emergency Department (HOSPITAL_COMMUNITY)

## 2023-07-05 ENCOUNTER — Encounter (HOSPITAL_COMMUNITY): Payer: Self-pay | Admitting: Emergency Medicine

## 2023-07-05 ENCOUNTER — Other Ambulatory Visit: Payer: Self-pay

## 2023-07-05 ENCOUNTER — Inpatient Hospital Stay (HOSPITAL_COMMUNITY)
Admission: EM | Admit: 2023-07-05 | Discharge: 2023-07-07 | DRG: 309 | Disposition: A | Discharge status: Home Health | Attending: Family Medicine | Admitting: Family Medicine

## 2023-07-05 DIAGNOSIS — Z79899 Other long term (current) drug therapy: Secondary | ICD-10-CM

## 2023-07-05 DIAGNOSIS — N1831 Chronic kidney disease, stage 3a: Secondary | ICD-10-CM | POA: Diagnosis not present

## 2023-07-05 DIAGNOSIS — J9811 Atelectasis: Secondary | ICD-10-CM | POA: Diagnosis not present

## 2023-07-05 DIAGNOSIS — I129 Hypertensive chronic kidney disease with stage 1 through stage 4 chronic kidney disease, or unspecified chronic kidney disease: Secondary | ICD-10-CM | POA: Diagnosis not present

## 2023-07-05 DIAGNOSIS — E119 Type 2 diabetes mellitus without complications: Secondary | ICD-10-CM

## 2023-07-05 DIAGNOSIS — R000 Tachycardia, unspecified: Secondary | ICD-10-CM | POA: Diagnosis not present

## 2023-07-05 DIAGNOSIS — F411 Generalized anxiety disorder: Secondary | ICD-10-CM | POA: Diagnosis present

## 2023-07-05 DIAGNOSIS — Z66 Do not resuscitate: Secondary | ICD-10-CM | POA: Diagnosis not present

## 2023-07-05 DIAGNOSIS — E1322 Other specified diabetes mellitus with diabetic chronic kidney disease: Secondary | ICD-10-CM | POA: Diagnosis not present

## 2023-07-05 DIAGNOSIS — E1122 Type 2 diabetes mellitus with diabetic chronic kidney disease: Secondary | ICD-10-CM | POA: Diagnosis not present

## 2023-07-05 DIAGNOSIS — N1832 Chronic kidney disease, stage 3b: Secondary | ICD-10-CM | POA: Diagnosis not present

## 2023-07-05 DIAGNOSIS — Z8673 Personal history of transient ischemic attack (TIA), and cerebral infarction without residual deficits: Secondary | ICD-10-CM | POA: Diagnosis not present

## 2023-07-05 DIAGNOSIS — N183 Chronic kidney disease, stage 3 unspecified: Secondary | ICD-10-CM | POA: Diagnosis present

## 2023-07-05 DIAGNOSIS — I700 Atherosclerosis of aorta: Secondary | ICD-10-CM | POA: Diagnosis not present

## 2023-07-05 DIAGNOSIS — R079 Chest pain, unspecified: Secondary | ICD-10-CM | POA: Diagnosis not present

## 2023-07-05 DIAGNOSIS — R5383 Other fatigue: Secondary | ICD-10-CM | POA: Diagnosis present

## 2023-07-05 DIAGNOSIS — Z833 Family history of diabetes mellitus: Secondary | ICD-10-CM | POA: Diagnosis not present

## 2023-07-05 DIAGNOSIS — J90 Pleural effusion, not elsewhere classified: Secondary | ICD-10-CM | POA: Diagnosis not present

## 2023-07-05 DIAGNOSIS — Z91148 Patient's other noncompliance with medication regimen for other reason: Secondary | ICD-10-CM | POA: Diagnosis not present

## 2023-07-05 DIAGNOSIS — I4891 Unspecified atrial fibrillation: Secondary | ICD-10-CM | POA: Diagnosis not present

## 2023-07-05 DIAGNOSIS — E7800 Pure hypercholesterolemia, unspecified: Secondary | ICD-10-CM | POA: Diagnosis not present

## 2023-07-05 DIAGNOSIS — R350 Frequency of micturition: Secondary | ICD-10-CM | POA: Diagnosis not present

## 2023-07-05 DIAGNOSIS — Z9049 Acquired absence of other specified parts of digestive tract: Secondary | ICD-10-CM | POA: Diagnosis not present

## 2023-07-05 DIAGNOSIS — I10 Essential (primary) hypertension: Secondary | ICD-10-CM | POA: Diagnosis not present

## 2023-07-05 DIAGNOSIS — R0602 Shortness of breath: Secondary | ICD-10-CM | POA: Diagnosis not present

## 2023-07-05 DIAGNOSIS — R627 Adult failure to thrive: Secondary | ICD-10-CM | POA: Diagnosis not present

## 2023-07-05 DIAGNOSIS — R410 Disorientation, unspecified: Secondary | ICD-10-CM | POA: Diagnosis not present

## 2023-07-05 DIAGNOSIS — E860 Dehydration: Secondary | ICD-10-CM | POA: Diagnosis not present

## 2023-07-05 DIAGNOSIS — I4892 Unspecified atrial flutter: Principal | ICD-10-CM | POA: Diagnosis present

## 2023-07-05 DIAGNOSIS — R0689 Other abnormalities of breathing: Secondary | ICD-10-CM | POA: Diagnosis not present

## 2023-07-05 DIAGNOSIS — R918 Other nonspecific abnormal finding of lung field: Secondary | ICD-10-CM | POA: Diagnosis not present

## 2023-07-05 DIAGNOSIS — Z888 Allergy status to other drugs, medicaments and biological substances status: Secondary | ICD-10-CM

## 2023-07-05 DIAGNOSIS — Z7901 Long term (current) use of anticoagulants: Secondary | ICD-10-CM

## 2023-07-05 DIAGNOSIS — E1151 Type 2 diabetes mellitus with diabetic peripheral angiopathy without gangrene: Secondary | ICD-10-CM | POA: Diagnosis present

## 2023-07-05 DIAGNOSIS — I499 Cardiac arrhythmia, unspecified: Secondary | ICD-10-CM | POA: Diagnosis not present

## 2023-07-05 LAB — COMPREHENSIVE METABOLIC PANEL WITH GFR
ALT: 15 U/L (ref 0–44)
AST: 24 U/L (ref 15–41)
Albumin: 3.5 g/dL (ref 3.5–5.0)
Alkaline Phosphatase: 99 U/L (ref 38–126)
Anion gap: 11 (ref 5–15)
BUN: 24 mg/dL — ABNORMAL HIGH (ref 8–23)
CO2: 20 mmol/L — ABNORMAL LOW (ref 22–32)
Calcium: 9.1 mg/dL (ref 8.9–10.3)
Chloride: 102 mmol/L (ref 98–111)
Creatinine, Ser: 1.1 mg/dL — ABNORMAL HIGH (ref 0.44–1.00)
GFR, Estimated: 47 mL/min — ABNORMAL LOW (ref >60)
Glucose, Bld: 139 mg/dL — ABNORMAL HIGH (ref 70–99)
Potassium: 3.9 mmol/L (ref 3.5–5.1)
Sodium: 133 mmol/L — ABNORMAL LOW (ref 135–145)
Total Bilirubin: 1.1 mg/dL (ref 0.0–1.2)
Total Protein: 6.7 g/dL (ref 6.5–8.1)

## 2023-07-05 LAB — TROPONIN I (HIGH SENSITIVITY)
Troponin I (High Sensitivity): 20 ng/L — ABNORMAL HIGH (ref <18)
Troponin I (High Sensitivity): 51 ng/L — ABNORMAL HIGH (ref <18)
Troponin I (High Sensitivity): 128 ng/L (ref <18)

## 2023-07-05 LAB — CBC WITH DIFFERENTIAL/PLATELET
Abs Immature Granulocytes: 0.02 10*3/uL (ref 0.00–0.07)
Basophils Absolute: 0.1 10*3/uL (ref 0.0–0.1)
Basophils Relative: 1 %
Eosinophils Absolute: 0 10*3/uL (ref 0.0–0.5)
Eosinophils Relative: 0 %
HCT: 38 % (ref 36.0–46.0)
Hemoglobin: 12.2 g/dL (ref 12.0–15.0)
Immature Granulocytes: 0 %
Lymphocytes Relative: 25 %
Lymphs Abs: 2 10*3/uL (ref 0.7–4.0)
MCH: 29.2 pg (ref 26.0–34.0)
MCHC: 32.1 g/dL (ref 30.0–36.0)
MCV: 90.9 fL (ref 80.0–100.0)
Monocytes Absolute: 0.9 10*3/uL (ref 0.1–1.0)
Monocytes Relative: 11 %
Neutro Abs: 5.3 10*3/uL (ref 1.7–7.7)
Neutrophils Relative %: 63 %
Platelets: 220 10*3/uL (ref 150–400)
RBC: 4.18 MIL/uL (ref 3.87–5.11)
RDW: 12.8 % (ref 11.5–15.5)
WBC: 8.2 10*3/uL (ref 4.0–10.5)
nRBC: 0 % (ref 0.0–0.2)

## 2023-07-05 LAB — MAGNESIUM: Magnesium: 2 mg/dL (ref 1.7–2.4)

## 2023-07-05 LAB — URINALYSIS, ROUTINE W REFLEX MICROSCOPIC
Bacteria, UA: NONE SEEN
Bilirubin Urine: NEGATIVE
Glucose, UA: NEGATIVE mg/dL
Hgb urine dipstick: NEGATIVE
Ketones, ur: 20 mg/dL — AB
Leukocytes,Ua: NEGATIVE
Nitrite: NEGATIVE
Protein, ur: 100 mg/dL — AB
Specific Gravity, Urine: 1.016 (ref 1.005–1.030)
pH: 5 (ref 5.0–8.0)

## 2023-07-05 LAB — BRAIN NATRIURETIC PEPTIDE: B Natriuretic Peptide: 308 pg/mL — ABNORMAL HIGH (ref 0.0–100.0)

## 2023-07-05 MED ORDER — METOPROLOL TARTRATE 25 MG PO TABS
25.0000 mg | ORAL_TABLET | Freq: Two times a day (BID) | ORAL | Status: DC
  Administered 2023-07-05: 25 mg via ORAL
  Filled 2023-07-05: qty 1

## 2023-07-05 MED ORDER — ONDANSETRON HCL 4 MG/2ML IJ SOLN: 4.0000 mg | Freq: Four times a day (QID) | INTRAMUSCULAR | Status: DC | PRN

## 2023-07-05 MED ORDER — HYDROCODONE-ACETAMINOPHEN 5-325 MG PO TABS
1.0000 | ORAL_TABLET | Freq: Once | ORAL | Status: AC
  Administered 2023-07-05: 1 via ORAL
  Filled 2023-07-05: qty 1

## 2023-07-05 MED ORDER — DILTIAZEM HCL 25 MG/5ML IV SOLN
10.0000 mg | Freq: Once | INTRAVENOUS | Status: AC
  Administered 2023-07-05: 10 mg via INTRAVENOUS
  Filled 2023-07-05: qty 5

## 2023-07-05 MED ORDER — DILTIAZEM HCL-DEXTROSE 125-5 MG/125ML-% IV SOLN (PREMIX)
5.0000 mg/h | INTRAVENOUS | Status: DC
  Administered 2023-07-05: 5 mg/h via INTRAVENOUS
  Filled 2023-07-05: qty 125

## 2023-07-05 MED ORDER — POLYETHYLENE GLYCOL 3350 17 G PO PACK: 17.0000 g | PACK | Freq: Every day | ORAL | Status: DC | PRN

## 2023-07-05 MED ORDER — APIXABAN 5 MG PO TABS
5.0000 mg | ORAL_TABLET | Freq: Two times a day (BID) | ORAL | Status: DC
  Administered 2023-07-05 – 2023-07-06 (×2): 5 mg via ORAL
  Filled 2023-07-05 (×2): qty 1

## 2023-07-05 MED ORDER — SODIUM CHLORIDE 0.9 % IV BOLUS
500.0000 mL | Freq: Once | INTRAVENOUS | Status: AC
  Administered 2023-07-05: 500 mL via INTRAVENOUS

## 2023-07-05 MED ORDER — ACETAMINOPHEN 325 MG PO TABS: 650.0000 mg | ORAL_TABLET | Freq: Four times a day (QID) | ORAL | Status: DC | PRN

## 2023-07-05 MED ORDER — ONDANSETRON HCL 4 MG PO TABS: 4.0000 mg | ORAL_TABLET | Freq: Four times a day (QID) | ORAL | Status: DC | PRN

## 2023-07-05 MED ORDER — CHLORHEXIDINE GLUCONATE CLOTH 2 % EX PADS
6.0000 | MEDICATED_PAD | Freq: Every day | CUTANEOUS | Status: DC
  Administered 2023-07-06: 6 via TOPICAL

## 2023-07-05 MED ORDER — SODIUM CHLORIDE 0.9 % IV SOLN: INTRAVENOUS | Status: AC

## 2023-07-05 MED ORDER — ACETAMINOPHEN 650 MG RE SUPP: 650.0000 mg | Freq: Four times a day (QID) | RECTAL | Status: DC | PRN

## 2023-07-05 NOTE — H&P (Signed)
 History and Physical    Dana [name] [id number] DOB: 1928/01/09 DOA: 07/05/2023  PCP: Orlena Bitters, MD   Patient coming from: Home  I have personally briefly reviewed patient's old medical records in [facility]  Chief Complaint: Lethargy  HPI: Dana [name] is a 89 y.o. female with medical history significant for atrial fibrillation, CKD 3, hypertension, diabetes mellitus, peripheral vascular disease, TIA. Patient was brought to the ED by EMS reports of lethargy.  At time of my evaluation, patient is a bit lethargic but able to answer questions, daughter Janae Mclean is at bedside and assists with the history. She reports patient fell outside on the grass about 3 weeks ago on her boat, 4 days later she started having pain and she saw her outpatient provider, x-rays were unremarkable, but since then patient has not been herself complaining of pain and unable to ambulate.  Over the past 3 to 4 days, patient has been lethargic, complaining of upper back pain.  She has been lying in bed for fear of getting up and falling, she has also had reduced oral intake over the past few days.  She reports onset of chest pressure to the mid chest radiating up to both shoulders that started this morning and has since resolved.  No difficulty breathing.  No cough, no fevers, no chills.  Reports chronic urinary frequency without dysuria.  No abdominal pain, no vomiting no diarrhea.  Prior to fall, patient has been very independent- lives alone, ambulates mostly without assistance or assistive devices, she was driving up until 2 months ago when she turned 38 and gave up her driver's license.  Daughter thinks patient's might also be a bit depressed due to loss of independence.  ED Course: Temperature 97.7.  Tachycardic to 150s in the ED, respiratory rate 15-20.  Blood pressure systolic 101-170.  O2 sats greater than 93% on room air. Troponin 20.  BNP 308. Chest x-ray with new obscuration of the left  hemidiaphragm-underlying pleural effusion atelectasis or airspace disease. IV Cardizem 10 mg bolus given, Cardizem drip started, patient converted to sinus rhythm. 500 mL bolus given.  Review of Systems: As per HPI all other systems reviewed and negative.  Past Medical History:  Diagnosis Date   Anxiety state, unspecified    Chronic kidney disease, stage III (moderate) ([facility])    Encounter for long-term (current) use of other medications    Headache(784.0)    HTN (hypertension)    Other and unspecified hyperlipidemia    Peripheral vascular disease, unspecified ([facility])    Pure hypercholesterolemia    Transient ischemic attack (TIA), and cerebral infarction without residual deficits(V12.54)    Type II or unspecified type diabetes mellitus without mention of complication, not stated as uncontrolled    Unspecified hypertensive kidney disease with chronic kidney disease stage I through stage IV, or unspecified(403.90)    Unspecified transient cerebral ischemia     Past Surgical History:  Procedure Laterality Date   CATARACT SURGERY     CHOLECYSTECTOMY       reports that she has never smoked. She has never used smokeless tobacco. She reports that she does not drink alcohol and does not use drugs.  Allergies  Allergen Reactions   Gabapentin  Other (See Comments)     Per patient "it made me drunk as a skunk"   Paxil [Paroxetine Hcl] Other (See Comments)    Made her feel very tired    Family History  Problem Relation Age of Onset  Cancer Father    Cancer Sister    Cancer Brother    Diabetes Mother    Diabetes Father    Cancer Daughter     Prior to Admission medications   Medication Sig Start Date End Date Taking? Authorizing Provider  ALPRAZolam (XANAX) 0.25 MG tablet Take 0.25 mg by mouth 2 (two) times daily as needed. 11/03/19   [provider]  dorzolamide-timolol (COSOPT) 2-0.5 % ophthalmic solution Place 1 drop into the left eye 2 (two) times daily.    [provider]  ELIQUIS 5 MG TABS tablet Take 5 mg by mouth 2 (two) times daily.    [provider]  metoprolol succinate (TOPROL-XL) 25 MG 24 hr tablet Take 12.5 mg by mouth daily. If tolerating well    [provider]  Multiple Vitamin (MULTI-VITAMIN) tablet Take 1 tablet by mouth daily.    [provider]  omeprazole (PRILOSEC) 40 MG capsule Take 40 mg by mouth daily.    [provider]  propranolol (INDERAL) 60 MG tablet Take 60 mg by mouth 3 (three) times daily. Patient not taking: Reported on 04/29/2022    [provider]  simvastatin (ZOCOR) 20 MG tablet Take 20 mg by mouth every evening.    [provider]  vitamin B-12 (CYANOCOBALAMIN ) 1000 MCG tablet Take 1,000 mcg by mouth daily.    [provider]  VYZULTA 0.024 % SOLN Place 1 drop into both eyes at bedtime. 04/04/22   [provider]    Physical Exam: Vitals:   07/05/23 1338 07/05/23 1355 07/05/23 1405 07/05/23 1440  BP:  (!) 156/63 (!) 144/66 138/76  Pulse: 75 74 73 69  Resp: 17 19 17 15   Temp:      TempSrc:      SpO2: 93% 95% 94% 95%  Weight:      Height:        Constitutional: NAD, calm, comfortable Vitals:   07/05/23 1338 07/05/23 1355 07/05/23 1405 07/05/23 1440  BP:  (!) 156/63 (!) 144/66 138/76  Pulse: 75 74 73 69  Resp: 17 19 17 15   Temp:      TempSrc:      SpO2: 93% 95% 94% 95%  Weight:      Height:       Eyes: PERRL, lids and conjunctivae normal ENMT: Mucous membranes are moist.  Neck: normal, supple, no masses, no thyromegaly Respiratory: clear to auscultation bilaterally, no wheezing, no crackles. Normal respiratory effort. No accessory muscle use.  Cardiovascular: Regular rate and rhythm, no murmurs / rubs / gallops. No extremity edema.  Extremities warm. Abdomen: no tenderness, no masses palpated. No hepatosplenomegaly. Bowel sounds positive.  Musculoskeletal: no clubbing / cyanosis. No joint deformity upper and lower extremities.   Skin: no rashes, lesions, ulcers. No induration Neurologic: No facial asymmetry, moving extremities spontaneously, speech fluent. Psychiatric: Normal judgment and insight, awake, mildly lethargic, oriented x 3.  Appropriate mood.   Labs on Admission: I have personally reviewed following labs and imaging studies  CBC: Recent Labs  Lab 07/05/23 1244  WBC 8.2  NEUTROABS 5.3  HGB 12.2  HCT 38.0  MCV 90.9  PLT 220   Basic Metabolic Panel: Recent Labs  Lab 07/05/23 1244  NA 133*  K 3.9  CL 102  CO2 20*  GLUCOSE 139*  BUN 24*  CREATININE 1.10*  CALCIUM 9.1   GFR: Estimated Creatinine Clearance: 27 mL/min (A) (by C-G formula based on SCr of 1.1 mg/dL (H)). Liver Function Tests:  Recent Labs  Lab 07/05/23 1244  AST 24  ALT 15  ALKPHOS 99  BILITOT 1.1  PROT 6.7  ALBUMIN 3.5   Urine analysis:    Component Value Date/Time   COLORURINE STRAW (A) 04/19/2022 1555   APPEARANCEUR CLEAR 04/19/2022 1555   LABSPEC 1.014 04/19/2022 1555   PHURINE 7.0 04/19/2022 1555   GLUCOSEU NEGATIVE 04/19/2022 1555   HGBUR NEGATIVE 04/19/2022 1555   BILIRUBINUR NEGATIVE 04/19/2022 1555   KETONESUR NEGATIVE 04/19/2022 1555   PROTEINUR NEGATIVE 04/19/2022 1555   NITRITE NEGATIVE 04/19/2022 1555   LEUKOCYTESUR NEGATIVE 04/19/2022 1555    Radiological Exams on Admission: DG Chest Port 1 View Result Date: 07/05/2023 CLINICAL DATA:  Shortness of breath. EXAM: PORTABLE CHEST 1 VIEW COMPARISON:  04/19/2022 FINDINGS: Stable cardiomediastinal contours. Aortic atherosclerosis. Chronic coarsened interstitial markings and bronchial wall thickening is unchanged from previous exam. The left hemidiaphragm is now obscured which may reflect underlying pleural effusion, atelectasis or airspace disease. Right lung appears clear. The visualized osseous structures appear grossly intact. IMPRESSION: 1. New obscuration of the left hemidiaphragm which may reflect underlying pleural effusion, atelectasis or  airspace disease. 2. Chronic bronchial wall thickening. Electronically Signed   By: Kimberley Penman M.D.   On: 07/05/2023 13:50   EKG: Independently reviewed.  Initial EKG shows atrial fibrillation rate 134.  Subsequent EKG-  sinus rhythm, rate 76, QTc 446.  Assessment/Plan Principal Problem:   Atrial flutter with rapid ventricular response (HCC) Active Problems:   Chronic kidney disease, stage III (moderate) ([facility])   DM (diabetes mellitus) (HCC)   HTN (hypertension)  Assessment and Plan: * Atrial flutter with rapid ventricular response (HCC) History of atrial fibrillation, on chronic anticoagulation noncompliance.  Presenting with lethargy, chest pressure, back pain.  Heart rate up to 150s in the ED, converted to sinus rhythm with Cardizem.  Troponin 20> 51.  BNP 308.  Last echo on file from 2016 EF of 65 to 70%, G1DD. -IV Cardizem 10 mg given, currently on Cardizem drip -Wean off Cardizem as able -Resume metoprolol, at increased dose to 25 units BID dosing for now (home dose 12.5 mg) -Resume Eliquis - Trend troponin - Check Mag  HTN (hypertension) Blood pressure 101-170. -Wean off Cardizem drip, resume metoprolol at increased dose  DM (diabetes mellitus) (HCC) Not on medication.  Last A1c 2024-5.8.  Chronic kidney disease, stage III (moderate) ([facility]CC) Creatinine 1.1, stable.  Baseline CKD 3A.   Lethargy- afebrile without leukocytosis.  Chest x-ray -obscuration of left hemidiaphragm, atelectasis, pleural effusion or airspace disease.  - She denies respiratory symptoms, hold off on antibiotics for now  - Check UA.  - bolus given, continue N/s 75cc/hr x 15hrs - PT eval   DVT prophylaxis: Eliquis Code Status: DNR-confirmed with patient and daughterJanae Mclean at bedside. Family Communication: Daughter- Chemical engineer at bedside Disposition Plan: ~ 2 days Consults called: None Admission status: Inpt Stepdown I certify that at the point of admission it is my clinical judgment that  the patient will require inpatient hospital care spanning beyond 2 midnights from the point of admission due to high intensity of service, high risk for further deterioration and high frequency of surveillance required.    CRITICAL CARE Performed by: Pati Bonine   Total critical care time: 70 minutes  Critical care time was exclusive of separately billable procedures and treating other patients.  Critical care was necessary to treat or prevent imminent or life-threatening deterioration.  Critical care was time spent personally by me on the following  activities: development of treatment plan with patient and/or surrogate as well as nursing, discussions with consultants, evaluation of patient's response to treatment, examination of patient, obtaining history from patient or surrogate, ordering and performing treatments and interventions, ordering and review of laboratory studies, ordering and review of radiographic studies, pulse oximetry and re-evaluation of patient's condition.   Author: Pati Bonine, MD 07/05/2023 4:14 PM  For on call review [email or web address].

## 2023-07-05 NOTE — Assessment & Plan Note (Signed)
 Creatinine 1.1, stable.  Baseline CKD 3A.

## 2023-07-05 NOTE — ED Notes (Signed)
 Abrasion noted to pts nose

## 2023-07-05 NOTE — ED Notes (Signed)
 Pt has bilateral hearing aids in ears at this time

## 2023-07-05 NOTE — Assessment & Plan Note (Signed)
 Not on medication.  Last A1c 2024-5.8.

## 2023-07-05 NOTE — ED Provider Notes (Signed)
 [facility] Provider Note   CSN: [id number] Arrival date & time: 07/05/23  1215     History {Add pertinent medical, surgical, social history, OB history to HPI:1} Chief Complaint  Patient presents with   Chest Pain   Headache   Back Pain    Dana [name] is a 89 y.o. female.  Patient with history of hypertension and atrial fibrillation.  She states she has been feeling weak for a number of days.   Chest Pain Associated symptoms: back pain and headache   Headache Associated symptoms: back pain   Back Pain Associated symptoms: chest pain and headaches        Home Medications Prior to Admission medications   Medication Sig Start Date End Date Taking? Authorizing Provider  ALPRAZolam (XANAX) 0.25 MG tablet Take 0.25 mg by mouth 2 (two) times daily as needed. 11/03/19   [provider]  dorzolamide-timolol (COSOPT) 2-0.5 % ophthalmic solution Place 1 drop into the left eye 2 (two) times daily.    [provider]  ELIQUIS 5 MG TABS tablet Take 5 mg by mouth 2 (two) times daily.    [provider]  metoprolol succinate (TOPROL-XL) 25 MG 24 hr tablet Take 12.5 mg by mouth daily. If tolerating well    [provider]  Multiple Vitamin (MULTI-VITAMIN) tablet Take 1 tablet by mouth daily.    [provider]  omeprazole (PRILOSEC) 40 MG capsule Take 40 mg by mouth daily.    [provider]  propranolol (INDERAL) 60 MG tablet Take 60 mg by mouth 3 (three) times daily. Patient not taking: Reported on 04/29/2022    [provider]  simvastatin (ZOCOR) 20 MG tablet Take 20 mg by mouth every evening.    [provider]  vitamin B-12 (CYANOCOBALAMIN ) 1000 MCG tablet Take 1,000 mcg by mouth daily.    [provider]  VYZULTA 0.024 % SOLN Place 1 drop into both eyes at bedtime. 04/04/22   [provider]      Allergies    Gabapentin  and Paxil [paroxetine  hcl]    Review of Systems   Review of Systems  Cardiovascular:  Positive for chest pain.  Musculoskeletal:  Positive for back pain.  Neurological:  Positive for headaches.    Physical Exam Updated Vital Signs BP 138/76   Pulse 69   Temp 97.7 F (36.5 C) (Oral)   Resp 15   Ht 5\' 4"  (1.626 m)   Wt 61.2 kg   SpO2 95%   BMI 23.17 kg/m  Physical Exam  ED Results / Procedures / Treatments   Labs (all labs ordered are listed, but only abnormal results are displayed) Labs Reviewed  COMPREHENSIVE METABOLIC PANEL WITH GFR - Abnormal; Notable for the following components:      Result Value   Sodium 133 (*)    CO2 20 (*)    Glucose, Bld 139 (*)    BUN 24 (*)    Creatinine, Ser 1.10 (*)    GFR, Estimated 47 (*)    All other components within normal limits  BRAIN NATRIURETIC PEPTIDE - Abnormal; Notable for the following components:   B Natriuretic Peptide 308.0 (*)    All other components within normal limits  TROPONIN I (HIGH SENSITIVITY) - Abnormal; Notable for the following components:   Troponin I (High Sensitivity) 20 (*)    All other components within normal limits  CBC WITH DIFFERENTIAL/PLATELET  TROPONIN I (HIGH SENSITIVITY)  EKG None  Radiology DG Chest Port 1 View Result Date: 07/05/2023 CLINICAL DATA:  Shortness of breath. EXAM: PORTABLE CHEST 1 VIEW COMPARISON:  04/19/2022 FINDINGS: Stable cardiomediastinal contours. Aortic atherosclerosis. Chronic coarsened interstitial markings and bronchial wall thickening is unchanged from previous exam. The left hemidiaphragm is now obscured which may reflect underlying pleural effusion, atelectasis or airspace disease. Right lung appears clear. The visualized osseous structures appear grossly intact. IMPRESSION: 1. New obscuration of the left hemidiaphragm which may reflect underlying pleural effusion, atelectasis or airspace disease. 2. Chronic bronchial wall thickening. Electronically Signed   By: Kimberley Penman M.D.   On:  07/05/2023 13:50    Procedures Procedures  {Document cardiac monitor, telemetry assessment procedure when appropriate:1}  Medications Ordered in ED Medications  diltiazem (CARDIZEM) 125 mg in dextrose 5% 125 mL (1 mg/mL) infusion (10 mg/hr Intravenous Rate/Dose Verify 07/05/23 1507)  sodium chloride  0.9 % bolus 500 mL (0 mLs Intravenous Stopped 07/05/23 1358)  diltiazem (CARDIZEM) injection 10 mg (10 mg Intravenous Given 07/05/23 1258)    ED Course/ Medical Decision Making/ A&P   {  CRITICAL CARE Performed by: Cheyenne Cotta Total critical care time: 45 minutes Critical care time was exclusive of separately billable procedures and treating other patients. Critical care was necessary to treat or prevent imminent or life-threatening deterioration. Critical care was time spent personally by me on the following activities: development of treatment plan with patient and/or surrogate as well as nursing, discussions with consultants, evaluation of patient's response to treatment, examination of patient, obtaining history from patient or surrogate, ordering and performing treatments and interventions, ordering and review of laboratory studies, ordering and review of radiographic studies, pulse oximetry and re-evaluation of patient's condition.  Click here for ABCD2, HEART and other calculatorsREFRESH Note before signing :1}                              Medical Decision Making Amount and/or Complexity of Data Reviewed Labs: ordered. Radiology: ordered.  Risk Prescription drug management. Decision regarding hospitalization.   Patient will be admitted for atrial fibrillation with RVR  {Document critical care time when appropriate:1} {Document review of labs and clinical decision tools ie heart score, Chads2Vasc2 etc:1}  {Document your independent review of radiology images, and any outside records:1} {Document your discussion with family members, caretakers, and with  consultants:1} {Document social determinants of health affecting pt's care:1} {Document your decision making why or why not admission, treatments were needed:1} Final Clinical Impression(s) / ED Diagnoses Final diagnoses:  Atrial fibrillation with RVR (HCC)    Rx / DC Orders ED Discharge Orders     None

## 2023-07-05 NOTE — ED Notes (Addendum)
 Able to use bedpan. Pt able to assist with turning

## 2023-07-05 NOTE — ED Triage Notes (Signed)
 Pt arrives to triage via EMS. Pt's family member called c/o pt being lethargic and not feeling well. Pt reports that she started to have chest pain that radiates across her shoulder blades with a headache since yesterday. Pt has hx of afib rvr HR at 140s in triage.  Pt endorses nausea.

## 2023-07-05 NOTE — Progress Notes (Signed)
 Date and time results received: 07/05/23 2053 (use smartphrase ".now" to insert current time)  Test: Troponin  Critical Value: 128  Name of Provider Notified: Maye Speak, MD  Orders Received? Or Actions Taken?: see new orders

## 2023-07-05 NOTE — Assessment & Plan Note (Addendum)
 Blood pressure 101-170. -Wean off Cardizem drip, resume metoprolol at increased dose

## 2023-07-05 NOTE — Assessment & Plan Note (Addendum)
 History of atrial fibrillation, on chronic anticoagulation noncompliance.  Presenting with lethargy, chest pressure, back pain.  Heart rate up to 150s in the ED, converted to sinus rhythm with Cardizem.  Troponin 20> 51.  BNP 308.  Last echo on file from 2016 EF of 65 to 70%, G1DD. -IV Cardizem 10 mg given, currently on Cardizem drip -Wean off Cardizem as able -Resume metoprolol, at increased dose to 25 units BID dosing for now (home dose 12.5 mg) -Resume Eliquis - Trend troponin - Check Mag

## 2023-07-06 DIAGNOSIS — I4892 Unspecified atrial flutter: Secondary | ICD-10-CM | POA: Diagnosis not present

## 2023-07-06 DIAGNOSIS — N1832 Chronic kidney disease, stage 3b: Secondary | ICD-10-CM

## 2023-07-06 LAB — TROPONIN I (HIGH SENSITIVITY): Troponin I (High Sensitivity): 114 ng/L (ref <18)

## 2023-07-06 LAB — CBC
HCT: 37.7 % (ref 36.0–46.0)
Hemoglobin: 12.4 g/dL (ref 12.0–15.0)
MCH: 30.4 pg (ref 26.0–34.0)
MCHC: 32.9 g/dL (ref 30.0–36.0)
MCV: 92.4 fL (ref 80.0–100.0)
Platelets: 217 10*3/uL (ref 150–400)
RBC: 4.08 MIL/uL (ref 3.87–5.11)
RDW: 12.8 % (ref 11.5–15.5)
WBC: 6.7 10*3/uL (ref 4.0–10.5)
nRBC: 0 % (ref 0.0–0.2)

## 2023-07-06 LAB — MRSA NEXT GEN BY PCR, NASAL: MRSA by PCR Next Gen: NOT DETECTED

## 2023-07-06 LAB — BASIC METABOLIC PANEL WITH GFR
Anion gap: 9 (ref 5–15)
BUN: 20 mg/dL (ref 8–23)
CO2: 23 mmol/L (ref 22–32)
Calcium: 9 mg/dL (ref 8.9–10.3)
Chloride: 105 mmol/L (ref 98–111)
Creatinine, Ser: 1.01 mg/dL — ABNORMAL HIGH (ref 0.44–1.00)
GFR, Estimated: 52 mL/min — ABNORMAL LOW (ref >60)
Glucose, Bld: 107 mg/dL — ABNORMAL HIGH (ref 70–99)
Potassium: 4.1 mmol/L (ref 3.5–5.1)
Sodium: 137 mmol/L (ref 135–145)

## 2023-07-06 MED ORDER — SODIUM CHLORIDE 0.9 % IV SOLN: INTRAVENOUS | Status: AC

## 2023-07-06 MED ORDER — HYDRALAZINE HCL 25 MG PO TABS
25.0000 mg | ORAL_TABLET | Freq: Three times a day (TID) | ORAL | Status: DC
  Administered 2023-07-06 – 2023-07-07 (×3): 25 mg via ORAL
  Filled 2023-07-06 (×3): qty 1

## 2023-07-06 MED ORDER — APIXABAN 2.5 MG PO TABS
2.5000 mg | ORAL_TABLET | Freq: Two times a day (BID) | ORAL | Status: DC
  Administered 2023-07-06 – 2023-07-07 (×2): 2.5 mg via ORAL
  Filled 2023-07-06 (×2): qty 1

## 2023-07-06 MED ORDER — HYDRALAZINE HCL 20 MG/ML IJ SOLN
10.0000 mg | INTRAMUSCULAR | Status: DC | PRN
  Administered 2023-07-06: 10 mg via INTRAVENOUS
  Filled 2023-07-06 (×2): qty 1

## 2023-07-06 MED ORDER — METOPROLOL TARTRATE 25 MG PO TABS
25.0000 mg | ORAL_TABLET | Freq: Two times a day (BID) | ORAL | Status: DC
  Administered 2023-07-06 – 2023-07-07 (×3): 25 mg via ORAL
  Filled 2023-07-06 (×3): qty 1

## 2023-07-06 MED ORDER — AMLODIPINE BESYLATE 5 MG PO TABS
5.0000 mg | ORAL_TABLET | Freq: Every day | ORAL | Status: DC
  Administered 2023-07-06 – 2023-07-07 (×2): 5 mg via ORAL
  Filled 2023-07-06 (×2): qty 1

## 2023-07-06 MED ORDER — HYDRALAZINE HCL 20 MG/ML IJ SOLN
5.0000 mg | INTRAMUSCULAR | Status: DC | PRN
Start: 2023-07-06 — End: 2023-07-06

## 2023-07-06 NOTE — Progress Notes (Signed)
 Date and time results received: 07/06/23 0148    Test: Troponin Critical Value: 114  Name of Provider Notified: Jacklynn Mask, MD  Orders Received? Or Actions Taken?: no new orders at this time

## 2023-07-06 NOTE — Progress Notes (Signed)
 Report given to brandi, RN, no further questions at this time.

## 2023-07-06 NOTE — Hospital Course (Signed)
 89 y.o. female with medical history significant for atrial fibrillation, CKD 3, hypertension, diabetes mellitus, peripheral vascular disease, TIA.  Patient was brought to the ED by EMS reports of lethargy.  She reports patient fell outside on the grass about 3 weeks ago off her boat, 4 days later she started having knee pain and she saw her outpatient provider, x-rays were unremarkable, but since then patient has not been herself complaining of pain and unable to ambulate.    Over the past 3 to 4 days prior to arrival, patient has been lethargic, complaining of upper back pain.  She has been lying in bed for fear of getting up and falling, she has also had reduced oral intake over the past few days.  She reports onset of chest pressure to the mid chest radiating up to both shoulders that started this morning and has since resolved.  No difficulty breathing.  No cough, no fevers, no chills.  Reports chronic urinary frequency without dysuria.  No abdominal pain, no vomiting no diarrhea.   Prior to fall, patient has been very independent- lives alone, ambulates mostly without assistance or assistive devices, she was driving up until 2 months ago when she turned 44 and gave up her driver's license.  Daughter thinks patient's might also be a bit depressed due to loss of independence.

## 2023-07-06 NOTE — Plan of Care (Signed)

## 2023-07-06 NOTE — Progress Notes (Signed)
 Patient arrived to room 318 from ICU.  Assessment complete, VS obtained, and Admission database began.

## 2023-07-06 NOTE — Progress Notes (Signed)
 PROGRESS NOTE   Dana [name]  Dana [name] DOB: 1928-01-09 DOA: 07/05/2023 PCP: Dana Bitters, MD   Chief Complaint  Patient presents with   Chest Pain   Headache   Back Pain   Level of care: Telemetry  Brief Admission History:  89 y.o. female with medical history significant for atrial fibrillation, CKD 3, hypertension, diabetes mellitus, peripheral vascular disease, TIA.  Patient was brought to the ED by EMS reports of lethargy.  She reports patient fell outside on the grass about 3 weeks ago off her boat, 4 days later she started having knee pain and she saw her outpatient provider, x-rays were unremarkable, but since then patient has not been herself complaining of pain and unable to ambulate.    Over the past 3 to 4 days prior to arrival, patient has been lethargic, complaining of upper back pain.  She has been lying in bed for fear of getting up and falling, she has also had reduced oral intake over the past few days.  She reports onset of chest pressure to the mid chest radiating up to both shoulders that started this morning and has since resolved.  No difficulty breathing.  No cough, no fevers, no chills.  Reports chronic urinary frequency without dysuria.  No abdominal pain, no vomiting no diarrhea.   Prior to fall, patient has been very independent- lives alone, ambulates mostly without assistance or assistive devices, she was driving up until 2 months ago when she turned 73 and gave up her driver's license.  Daughter thinks patient's might also be a bit depressed due to loss of independence.   Assessment and Plan:   Afib with RVR - resolved after treatment  -IV Cardizem 10 mg given, was briefly on Cardizem drip -Weaned off Cardizem in fusion -Resumed metoprolol, at increased dose to 25 units BID dosing for now (home dose 12.5 mg) -Resume apixaban for full dose anticoagulation  HTN (hypertension) - suboptimally controlled - BP remains elevated - adding  hydralazine  Type 2 DM (diabetes mellitus) - controlled by diet  Not on medication.  Last A1c 2024-5.8.  Chronic kidney disease, stage IIIb - follow BMP   Dehydration - IV fluid hydration ordered   Generalized Weakness Adult Failure to Thrive - PT /OT evaluation requested - supportive measures   DNR - present on admission  - continue DNR order in hospital   DVT prophylaxis: apixaban  Code Status: DNR  Family Communication:  Disposition: TBD   Consultants:   Procedures:   Antimicrobials:    Subjective: Pt is sitting up in chair, feels weak, but not as severe as yesterday, denies chest pain and palpitations.    Objective: Vitals:   07/06/23 0505 07/06/23 0700 07/06/23 0900 07/06/23 1000  BP: (!) 186/55 (!) 147/82  (!) 180/45  Pulse: 61 (!) 55  (!) 52  Resp: (!) 21 15  19   Temp:   97.6 F (36.4 C)   TempSrc:   Axillary   SpO2: 96% 95%  96%  Weight:      Height:        Intake/Output Summary (Last 24 hours) at 07/06/2023 1125 Last data filed at 07/06/2023 0834 Gross per 24 hour  Intake 1799.13 ml  Output 1050 ml  Net 749.13 ml   Filed Weights   07/05/23 1225  Weight: 61.2 kg   Examination:  General exam: Appears calm and comfortable  Respiratory system: Clear to auscultation. Respiratory effort normal. Cardiovascular system: normal S1 & S2 heard. No JVD,  murmurs, rubs, gallops or clicks. No pedal edema. Gastrointestinal system: Abdomen is nondistended, soft and nontender. No organomegaly or masses felt. Normal bowel sounds heard. Central nervous system: Alert and oriented. No focal neurological deficits. Extremities: Symmetric 5 x 5 power. Skin: No rashes, lesions or ulcers. Psychiatry: Judgement and insight appear normal. Mood & affect appropriate.   Data Reviewed: I have personally reviewed following labs and imaging studies  CBC: Recent Labs  Lab 07/05/23 1244 07/06/23 0100  WBC 8.2 6.7  NEUTROABS 5.3  --   HGB 12.2 12.4  HCT 38.0 37.7  MCV  90.9 92.4  PLT 220 217    Basic Metabolic Panel: Recent Labs  Lab 07/05/23 1244 07/06/23 0100  NA 133* 137  K 3.9 4.1  CL 102 105  CO2 20* 23  GLUCOSE 139* 107*  BUN 24* 20  CREATININE 1.10* 1.01*  CALCIUM 9.1 9.0  MG 2.0  --     CBG: No results for input(s): "GLUCAP" in the last 168 hours.  Recent Results (from the past 240 hours)  MRSA Next Gen by PCR, Nasal     Status: None   Collection Time: 07/05/23  5:00 PM   Specimen: Nasal Mucosa; Nasal Swab  Result Value Ref Range Status   MRSA by PCR Next Gen NOT DETECTED NOT DETECTED Final    Comment: (NOTE) The GeneXpert MRSA Assay (FDA approved for NASAL specimens only), is one component of a comprehensive MRSA colonization surveillance program. It is not intended to diagnose MRSA infection nor to guide or monitor treatment for MRSA infections. Test performance is not FDA approved in patients less than 22 years old. Performed at [facility], [address] [place], [place] [address]      Radiology Studies: [facility] Chest [facility] 1 View Result Date: 07/05/2023 CLINICAL DATA:  Shortness of breath. EXAM: PORTABLE CHEST 1 VIEW COMPARISON:  04/19/2022 FINDINGS: Stable cardiomediastinal contours. Aortic atherosclerosis. Chronic coarsened interstitial markings and bronchial wall thickening is unchanged from previous exam. The left hemidiaphragm is now obscured which may reflect underlying pleural effusion, atelectasis or airspace disease. Right lung appears clear. The visualized osseous structures appear grossly intact. IMPRESSION: 1. New obscuration of the left hemidiaphragm which may reflect underlying pleural effusion, atelectasis or airspace disease. 2. Chronic bronchial wall thickening. Electronically Signed   By: Kimberley Penman M.D.   On: 07/05/2023 13:50   Scheduled Meds:  amLODipine   5 mg Oral Daily   apixaban  2.5 mg Oral BID   Chlorhexidine Gluconate Cloth  6 each Topical Q0600   metoprolol tartrate  25 mg Oral BID    Continuous Infusions:  sodium chloride  40 mL/hr at 07/06/23 0858    LOS: 1 day   Time spent: 55 mins  Latavion Halls Lincoln Renshaw, MD How to contact the [facility] Attending or Consulting provider 7A - 7P or covering provider during after hours 7P -7A, for this patient?  Check the care team in [facility] and look for a) attending/consulting TR[facility] provider listed and b) the TRH team listed Log into [email or web address] to find provider on call.  Locate the TR[facility] provider you are looking for under [facility] and page to a number that you can be directly reached. If you still have difficulty reaching the provider, please page the [facility] (Director on Call) for the Hospitalists listed on amion for assistance.  07/06/2023, 11:25 AM

## 2023-07-06 NOTE — Progress Notes (Addendum)
   07/06/23 1533  TOC Brief Assessment  Insurance and Status Reviewed  Patient has primary care physician Yes  Home environment has been reviewed Home with family  Prior level of function: independent  Prior/Current Home Services No current home services  Social Drivers of Health Review SDOH reviewed no interventions necessary  Readmission risk has been reviewed Yes  Transition of care needs no transition of care needs at this time   TOC following for PT eval. No follow up needed.   Transition of Care Department [facility]) has reviewed patient and no TOC needs have been identified at this time. We will continue to monitor patient advancement through interdisciplinary progression rounds. If new patient transition needs arise, please place a TOC consult.

## 2023-07-06 NOTE — Evaluation (Signed)
 Physical Therapy Evaluation Patient Details Name: Dana [name] MRN: [id number] DOB: 1927/07/07 Today's Date: 07/06/2023  History of Present Illness  Dana [name] is a 89 y.o. female with medical history significant for atrial fibrillation, CKD 3, hypertension, diabetes mellitus, peripheral vascular disease, TIA.  Patient was brought to the ED by EMS reports of lethargy.  At time of my evaluation, patient is a bit lethargic but able to answer questions, daughter Dana [name] is at bedside and assists with the history.  She reports patient fell outside on the grass about 3 weeks ago on her boat, 4 days later she started having pain and she saw her outpatient provider, x-rays were unremarkable, but since then patient has not been herself complaining of pain and unable to ambulate.  Over the past 3 to 4 days, patient has been lethargic, complaining of upper back pain.  She has been lying in bed for fear of getting up and falling, she has also had reduced oral intake over the past few days.  She reports onset of chest pressure to the mid chest radiating up to both shoulders that started this morning and has since resolved.  No difficulty breathing.  No cough, no fevers, no chills.  Reports chronic urinary frequency without dysuria.  No abdominal pain, no vomiting no diarrhea.     Prior to fall, patient has been very independent- lives alone, ambulates mostly without assistance or assistive devices, she was driving up until 2 months ago when she turned 9 and gave up her driver's license.  Daughter thinks patient's might also be a bit depressed due to loss of independence.   Clinical Impression  Patient functioning near baseline for functional mobility and gait demonstrating good return for ambulating in room, hallways without loss of balance using RW. Patient encouraged to ambulate with nursing staff, mobility techs for length of stay. Plan:  Patient discharged from physical therapy to care of nursing for  ambulation daily as tolerated for length of stay.          If plan is discharge home, recommend the following: Help with stairs or ramp for entrance;Assistance with cooking/housework;A little help with bathing/dressing/bathroom   Can travel by private vehicle        Equipment Recommendations None recommended by PT  Recommendations for Other Services       Functional Status Assessment Patient has had a recent decline in their functional status and/or demonstrates limited ability to make significant improvements in function in a reasonable and predictable amount of time     Precautions / Restrictions Precautions Precautions: Fall Restrictions Weight Bearing Restrictions Per Provider Order: No      Mobility  Bed Mobility Overal bed mobility: Modified Independent                  Transfers Overall transfer level: Modified independent                      Ambulation/Gait Ambulation/Gait assistance: Modified independent (Device/Increase time) Gait Distance (Feet): 100 Feet Assistive device: Rolling walker (2 wheels) Gait Pattern/deviations: Decreased step length - right, Decreased step length - left, Decreased stride length Gait velocity: decreased     General Gait Details: grossly WFL with good return for using RW without loss of balance  Stairs            Wheelchair Mobility     Tilt Bed    Modified Rankin (Stroke Patients Only)       Balance Overall  balance assessment: Needs assistance Sitting-balance support: Feet supported, No upper extremity supported Sitting balance-Leahy Scale: Good Sitting balance - Comments: seated at EOB   Standing balance support: During functional activity, No upper extremity supported Standing balance-Leahy Scale: Fair Standing balance comment: fair/poor without AD, fair/good using RW                             Pertinent Vitals/Pain Pain Assessment Pain Assessment: No/denies pain     Home Living Family/patient expects to be discharged to:: Private residence Living Arrangements: Children Available Help at Discharge: Family;Available 24 hours/day Type of Home: House Home Access: Ramped entrance       Home Layout: One level Home Equipment: Rollator (4 wheels);Rolling Walker (2 wheels);BSC/3in1      Prior Function Prior Level of Function : Needs assist       Physical Assist : Mobility (physical);ADLs (physical) Mobility (physical): Bed mobility;Transfers;Gait;Stairs   Mobility Comments: household and short community distances using Rollator ADLs Comments: Assisted by family     Extremity/Trunk Assessment   Upper Extremity Assessment Upper Extremity Assessment: Overall WFL for tasks assessed    Lower Extremity Assessment Lower Extremity Assessment: Overall WFL for tasks assessed    Cervical / Trunk Assessment Cervical / Trunk Assessment: Normal  Communication   Communication Communication: No apparent difficulties    Cognition Arousal: Alert Behavior During Therapy: WFL for tasks assessed/performed   PT - Cognitive impairments: No apparent impairments                         Following commands: Intact       Cueing       General Comments      Exercises     Assessment/Plan    PT Assessment Patient does not need any further PT services  PT Problem List         PT Treatment Interventions      PT Goals (Current goals can be found in the Care Plan section)  Acute Rehab PT Goals Patient Stated Goal: return home with family to assist PT Goal Formulation: With patient Time For Goal Achievement: 07/06/23 Potential to Achieve Goals: Good    Frequency       Co-evaluation               AM-PAC PT "6 Clicks" Mobility  Outcome Measure Help needed turning from your back to your side while in a flat bed without using bedrails?: None Help needed moving from lying on your back to sitting on the side of a flat bed  without using bedrails?: None Help needed moving to and from a bed to a chair (including a wheelchair)?: None Help needed standing up from a chair using your arms (e.g., wheelchair or bedside chair)?: None Help needed to walk in hospital room?: A Little Help needed climbing 3-5 steps with a railing? : A Little 6 Click Score: 22    End of Session   Activity Tolerance: Patient tolerated treatment well Patient left: in chair;with call bell/phone within reach Nurse Communication: Mobility status PT Visit Diagnosis: Unsteadiness on feet (R26.81);Other abnormalities of gait and mobility (R26.89);Muscle weakness (generalized) (M62.81)    Time: 1914-7829 PT Time Calculation (min) (ACUTE ONLY): 22 min   Charges:   PT Evaluation $PT Eval Moderate Complexity: 1 Mod PT Treatments $Therapeutic Activity: 8-22 mins PT General Charges $$ ACUTE PT VISIT: 1 Visit  3:54 PM, 07/06/23 Walton Guppy, MPT Physical Therapist with [facility] [phone] office [phone] mobile phone

## 2023-07-07 DIAGNOSIS — I4892 Unspecified atrial flutter: Secondary | ICD-10-CM | POA: Diagnosis not present

## 2023-07-07 DIAGNOSIS — N1832 Chronic kidney disease, stage 3b: Secondary | ICD-10-CM | POA: Diagnosis not present

## 2023-07-07 DIAGNOSIS — I10 Essential (primary) hypertension: Secondary | ICD-10-CM | POA: Diagnosis not present

## 2023-07-07 LAB — PHOSPHORUS: Phosphorus: 2.6 mg/dL (ref 2.5–4.6)

## 2023-07-07 LAB — CBC
HCT: 40 % (ref 36.0–46.0)
Hemoglobin: 13.1 g/dL (ref 12.0–15.0)
MCH: 29.9 pg (ref 26.0–34.0)
MCHC: 32.8 g/dL (ref 30.0–36.0)
MCV: 91.3 fL (ref 80.0–100.0)
Platelets: 240 10*3/uL (ref 150–400)
RBC: 4.38 MIL/uL (ref 3.87–5.11)
RDW: 13.1 % (ref 11.5–15.5)
WBC: 7.2 10*3/uL (ref 4.0–10.5)
nRBC: 0 % (ref 0.0–0.2)

## 2023-07-07 LAB — BASIC METABOLIC PANEL WITH GFR
Anion gap: 11 (ref 5–15)
BUN: 17 mg/dL (ref 8–23)
CO2: 20 mmol/L — ABNORMAL LOW (ref 22–32)
Calcium: 9.2 mg/dL (ref 8.9–10.3)
Chloride: 106 mmol/L (ref 98–111)
Creatinine, Ser: 0.96 mg/dL (ref 0.44–1.00)
GFR, Estimated: 55 mL/min — ABNORMAL LOW (ref >60)
Glucose, Bld: 97 mg/dL (ref 70–99)
Potassium: 3.8 mmol/L (ref 3.5–5.1)
Sodium: 137 mmol/L (ref 135–145)

## 2023-07-07 LAB — MAGNESIUM: Magnesium: 2.2 mg/dL (ref 1.7–2.4)

## 2023-07-07 MED ORDER — METOPROLOL SUCCINATE ER 25 MG PO TB24: 25.0000 mg | ORAL_TABLET | Freq: Every day | ORAL | 1 refills | Status: DC

## 2023-07-07 MED ORDER — HYDRALAZINE HCL 25 MG PO TABS: 25.0000 mg | ORAL_TABLET | Freq: Three times a day (TID) | ORAL | 1 refills | Status: DC

## 2023-07-07 MED ORDER — AMLODIPINE BESYLATE 5 MG PO TABS: 5.0000 mg | ORAL_TABLET | Freq: Every day | ORAL | 1 refills | Status: AC

## 2023-07-07 NOTE — Discharge Instructions (Signed)
IMPORTANT INFORMATION: PAY CLOSE ATTENTION   PHYSICIAN DISCHARGE INSTRUCTIONS  Follow with Primary care provider  Vyas, Dhruv B, MD  and other consultants as instructed by your Hospitalist Physician  SEEK MEDICAL CARE OR RETURN TO EMERGENCY ROOM IF SYMPTOMS COME BACK, WORSEN OR NEW PROBLEM DEVELOPS   Please note: You were cared for by a hospitalist during your hospital stay. Every effort will be made to forward records to your primary care provider.  You can request that your primary care provider send for your hospital records if they have not received them.  Once you are discharged, your primary care physician will handle any further medical issues. Please note that NO REFILLS for any discharge medications will be authorized once you are discharged, as it is imperative that you return to your primary care physician (or establish a relationship with a primary care physician if you do not have one) for your post hospital discharge needs so that they can reassess your need for medications and monitor your lab values.  Please get a complete blood count and chemistry panel checked by your Primary MD at your next visit, and again as instructed by your Primary MD.  Get Medicines reviewed and adjusted: Please take all your medications with you for your next visit with your Primary MD  Laboratory/radiological data: Please request your Primary MD to go over all hospital tests and procedure/radiological results at the follow up, please ask your primary care provider to get all Hospital records sent to his/her office.  In some cases, they will be blood work, cultures and biopsy results pending at the time of your discharge. Please request that your primary care provider follow up on these results.  If you are diabetic, please bring your blood sugar readings with you to your follow up appointment with primary care.    Please call and make your follow up appointments as soon as possible.    Also Note  the following: If you experience worsening of your admission symptoms, develop shortness of breath, life threatening emergency, suicidal or homicidal thoughts you must seek medical attention immediately by calling 911 or calling your MD immediately  if symptoms less severe.  You must read complete instructions/literature along with all the possible adverse reactions/side effects for all the Medicines you take and that have been prescribed to you. Take any new Medicines after you have completely understood and accpet all the possible adverse reactions/side effects.   Do not drive when taking Pain medications or sleeping medications (Benzodiazepines)  Do not take more than prescribed Pain, Sleep and Anxiety Medications. It is not advisable to combine anxiety,sleep and pain medications without talking with your primary care practitioner  Special Instructions: If you have smoked or chewed Tobacco  in the last 2 yrs please stop smoking, stop any regular Alcohol  and or any Recreational drug use.  Wear Seat belts while driving.  Do not drive if taking any narcotic, mind altering or controlled substances or recreational drugs or alcohol.       

## 2023-07-07 NOTE — Discharge Summary (Signed)
 Physician Discharge Summary  Dana [name] ZOX:[id number] DOB: 26-Mar-1927 DOA: 07/05/2023  PCP: Orlena Bitters, MD  Admit date: 07/05/2023 Discharge date: 07/07/2023  Admitted From:  Home  Disposition: Home with HH and 24/7 supervision   Recommendations for Outpatient Follow-up:  Follow up with PCP in 1 weeks Please obtain BMP/CBC in one week  Home Health: PT / RN   Discharge Condition: STABLE   CODE STATUS: DNR DIET: Heart healthy carb modified   Brief Hospitalization Summary: Please see all hospital notes, images, labs for full details of the hospitalization. Admission provider HPI:  89 y.o. female with medical history significant for atrial fibrillation, CKD 3, hypertension, diabetes mellitus, peripheral vascular disease, TIA.  Patient was brought to the ED by EMS reports of lethargy.  She reports patient fell outside on the grass about 3 weeks ago off her boat, 4 days later she started having knee pain and she saw her outpatient provider, x-rays were unremarkable, but since then patient has not been herself complaining of pain and unable to ambulate.    Over the past 3 to 4 days prior to arrival, patient has been lethargic, complaining of upper back pain.  She has been lying in bed for fear of getting up and falling, she has also had reduced oral intake over the past few days.  She reports onset of chest pressure to the mid chest radiating up to both shoulders that started this morning and has since resolved.  No difficulty breathing.  No cough, no fevers, no chills.  Reports chronic urinary frequency without dysuria.  No abdominal pain, no vomiting no diarrhea.   Prior to fall, patient has been very independent- lives alone, ambulates mostly without assistance or assistive devices, she was driving up until 2 months ago when she turned 28 and gave up her driver's license.  Daughter thinks patient's might also be a bit depressed due to loss of independence.  Hospital Course by listed  problems addressed  Afib with RVR - resolved after treatment  -IV Cardizem 10 mg given, was briefly on Cardizem drip -Weaned off Cardizem in fusion -increased home dose metoprolol succinate to 25 mg daily  -Resume apixaban for full dose anticoagulation   HTN (hypertension) - suboptimally controlled - BP remains elevated - adding hydralazine and amlodipine   - follow up with PCP for blood pressure recheck   Type 2 DM (diabetes mellitus) - controlled by diet  Not on medication.  Last A1c 2024-5.8.   Chronic kidney disease, stage IIIb - follow BMP    Dehydration - TREATED  - IV fluid hydration was given     Generalized Weakness Adult Failure to Thrive - PT /OT evaluation requested - supportive measures    DNR - present on admission  - continue DNR order in hospital   Acute delirium - delirium precautions advised - working to get home to regular environment as soon as possible   Discharge Diagnoses:  Principal Problem:   Atrial flutter with rapid ventricular response (HCC) Active Problems:   Chronic kidney disease, stage III (moderate) (HCC)   DM (diabetes mellitus) (HCC)   HTN (hypertension)   Discharge Instructions:  Allergies as of 07/07/2023       Reactions   Gabapentin  Other (See Comments)    Per patient "it made me drunk as a skunk"   Paxil [paroxetine Hcl] Other (See Comments)   Made her feel very tired        Medication List     STOP taking  these medications    ALPRAZolam 0.25 MG tablet Commonly known as: XANAX       TAKE these medications    amLODipine  5 MG tablet Commonly known as: NORVASC  Take 1 tablet (5 mg total) by mouth daily. Start taking on: Jul 08, 2023   apixaban 2.5 MG Tabs tablet Commonly known as: ELIQUIS Take 2.5 mg by mouth 2 (two) times daily.   dorzolamide-timolol 2-0.5 % ophthalmic solution Commonly known as: COSOPT Place 1 drop into the left eye 2 (two) times daily.   hydrALAZINE 25 MG tablet Commonly known as:  APRESOLINE Take 1 tablet (25 mg total) by mouth 3 (three) times daily.   metoprolol succinate 25 MG 24 hr tablet Commonly known as: TOPROL-XL Take 1 tablet (25 mg total) by mouth daily. What changed:  how much to take additional instructions   Multi-Vitamin tablet Take 1 tablet by mouth daily.   omeprazole 40 MG capsule Commonly known as: PRILOSEC Take 40 mg by mouth daily.   simvastatin 20 MG tablet Commonly known as: ZOCOR Take 20 mg by mouth every evening.   Vyzulta 0.024 % Soln Generic drug: Latanoprostene Bunod Place 1 drop into both eyes at bedtime.        Follow-up Information     Vyas, Dhruv B, MD Follow up in 1 week(s).   Specialty: Internal Medicine Why: Hospital Follow Up Contact information: [address] [place] [address] [phone]                Allergies  Allergen Reactions   Gabapentin  Other (See Comments)     Per patient "it made me drunk as a skunk"   Paxil [Paroxetine Hcl] Other (See Comments)    Made her feel very tired   Allergies as of 07/07/2023       Reactions   Gabapentin  Other (See Comments)    Per patient "it made me drunk as a skunk"   Paxil [paroxetine Hcl] Other (See Comments)   Made her feel very tired        Medication List     STOP taking these medications    ALPRAZolam 0.25 MG tablet Commonly known as: XANAX       TAKE these medications    amLODipine  5 MG tablet Commonly known as: NORVASC  Take 1 tablet (5 mg total) by mouth daily. Start taking on: Jul 08, 2023   apixaban 2.5 MG Tabs tablet Commonly known as: ELIQUIS Take 2.5 mg by mouth 2 (two) times daily.   dorzolamide-timolol 2-0.5 % ophthalmic solution Commonly known as: COSOPT Place 1 drop into the left eye 2 (two) times daily.   hydrALAZINE 25 MG tablet Commonly known as: APRESOLINE Take 1 tablet (25 mg total) by mouth 3 (three) times daily.   metoprolol succinate 25 MG 24 hr tablet Commonly known as: TOPROL-XL Take 1 tablet (25 mg  total) by mouth daily. What changed:  how much to take additional instructions   Multi-Vitamin tablet Take 1 tablet by mouth daily.   omeprazole 40 MG capsule Commonly known as: PRILOSEC Take 40 mg by mouth daily.   simvastatin 20 MG tablet Commonly known as: ZOCOR Take 20 mg by mouth every evening.   Vyzulta 0.024 % Soln Generic drug: Latanoprostene Bunod Place 1 drop into both eyes at bedtime.        Procedures/Studies: DG Chest Port 1 View Result Date: 07/05/2023 CLINICAL DATA:  Shortness of breath. EXAM: PORTABLE CHEST 1 VIEW COMPARISON:  04/19/2022 FINDINGS: Stable cardiomediastinal contours.  Aortic atherosclerosis. Chronic coarsened interstitial markings and bronchial wall thickening is unchanged from previous exam. The left hemidiaphragm is now obscured which may reflect underlying pleural effusion, atelectasis or airspace disease. Right lung appears clear. The visualized osseous structures appear grossly intact. IMPRESSION: 1. New obscuration of the left hemidiaphragm which may reflect underlying pleural effusion, atelectasis or airspace disease. 2. Chronic bronchial wall thickening. Electronically Signed   By: Kimberley Penman M.D.   On: 07/05/2023 13:50     Subjective: Pt without complaints other than wanting to go home.  She has been intermittently confused at times per family   Discharge Exam: Vitals:   07/07/23 0723 07/07/23 0809  BP: (!) 185/63 (!) 172/75  Pulse: 79   Resp:    Temp: 98.2 F (36.8 C)   SpO2: 98%    Vitals:   07/07/23 0019 07/07/23 0358 07/07/23 0723 07/07/23 0809  BP: (!) 153/51 138/69 (!) 185/63 (!) 172/75  Pulse: 72 73 79   Resp: 16 18    Temp: 98.5 F (36.9 C) 97.8 F (36.6 C) 98.2 F (36.8 C)   TempSrc: Oral Oral Oral   SpO2: 97% 98% 98%   Weight:      Height:       General: Pt is alert, awake, not in acute distress Cardiovascular: normal S1/S2 +, no rubs, no gallops Respiratory: CTA bilaterally, no wheezing, no  rhonchi Abdominal: Soft, NT, ND, bowel sounds + Extremities: no edema, no cyanosis   The results of significant diagnostics from this hospitalization (including imaging, microbiology, ancillary and laboratory) are listed below for reference.     Microbiology: Recent Results (from the past 240 hours)  MRSA Next Gen by PCR, Nasal     Status: None   Collection Time: 07/05/23  5:00 PM   Specimen: Nasal Mucosa; Nasal Swab  Result Value Ref Range Status   MRSA by PCR Next Gen NOT DETECTED NOT DETECTED Final    Comment: (NOTE) The GeneXpert MRSA Assay (FDA approved for NASAL specimens only), is one component of a comprehensive MRSA colonization surveillance program. It is not intended to diagnose MRSA infection nor to guide or monitor treatment for MRSA infections. Test performance is not FDA approved in patients less than 85 years old. Performed at [facility], [address], [place], [place] [address]      Labs: BNP (last 3 results) Recent Labs    07/05/23 1244  BNP 308.0*   Basic Metabolic Panel: Recent Labs  Lab 07/05/23 1244 07/06/23 0100 07/07/23 0406  NA 133* 137 137  K 3.9 4.1 3.8  CL 102 105 106  CO2 20* 23 20*  GLUCOSE 139* 107* 97  BUN 24* 20 17  CREATININE 1.10* 1.01* 0.96  CALCIUM 9.1 9.0 9.2  MG 2.0  --  2.2  PHOS  --   --  2.6   Liver Function Tests: Recent Labs  Lab 07/05/23 1244  AST 24  ALT 15  ALKPHOS 99  BILITOT 1.1  PROT 6.7  ALBUMIN 3.5   No results for input(s): "LIPASE", "AMYLASE" in the last 168 hours. No results for input(s): "AMMONIA" in the last 168 hours. CBC: Recent Labs  Lab 07/05/23 1244 07/06/23 0100 07/07/23 0406  WBC 8.2 6.7 7.2  NEUTROABS 5.3  --   --   HGB 12.2 12.4 13.1  HCT 38.0 37.7 40.0  MCV 90.9 92.4 91.3  PLT 220 217 240   Cardiac Enzymes: No results for input(s): "CKTOTAL", "CKMB", "CKMBINDEX", "TROPONINI" in the last 168  hours. BNP: Invalid input(s): "POCBNP" CBG: No results for input(s): "GLUCAP" in  the last 168 hours. D-Dimer No results for input(s): "DDIMER" in the last 72 hours. Hgb A1c No results for input(s): "HGBA1C" in the last 72 hours. Lipid Profile No results for input(s): "CHOL", "HDL", "LDLCALC", "TRIG", "CHOLHDL", "LDLDIRECT" in the last 72 hours. Thyroid  function studies No results for input(s): "TSH", "T4TOTAL", "T3FREE", "THYROIDAB" in the last 72 hours.  Invalid input(s): "FREET3" Anemia work up No results for input(s): "VITAMINB12", "FOLATE", "FERRITIN", "TIBC", "IRON", "RETICCTPCT" in the last 72 hours. Urinalysis    Component Value Date/Time   COLORURINE YELLOW 07/05/2023 1614   APPEARANCEUR CLEAR 07/05/2023 1614   LABSPEC 1.016 07/05/2023 1614   PHURINE 5.0 07/05/2023 1614   GLUCOSEU NEGATIVE 07/05/2023 1614   HGBUR NEGATIVE 07/05/2023 1614   BILIRUBINUR NEGATIVE 07/05/2023 1614   KETONESUR 20 (A) 07/05/2023 1614   PROTEINUR 100 (A) 07/05/2023 1614   NITRITE NEGATIVE 07/05/2023 1614   LEUKOCYTESUR NEGATIVE 07/05/2023 1614   Sepsis Labs Recent Labs  Lab 07/05/23 1244 07/06/23 0100 07/07/23 0406  WBC 8.2 6.7 7.2   Microbiology Recent Results (from the past 240 hours)  MRSA Next Gen by PCR, Nasal     Status: None   Collection Time: 07/05/23  5:00 PM   Specimen: Nasal Mucosa; Nasal Swab  Result Value Ref Range Status   MRSA by PCR Next Gen NOT DETECTED NOT DETECTED Final    Comment: (NOTE) The GeneXpert MRSA Assay (FDA approved for NASAL specimens only), is one component of a comprehensive MRSA colonization surveillance program. It is not intended to diagnose MRSA infection nor to guide or monitor treatment for MRSA infections. Test performance is not FDA approved in patients less than 43 years old. Performed at [facility] [address], [place], [place] [address]    Time coordinating discharge:  44 mins   SIGNED:  Faustino Hook, MD  [facility] 07/07/2023, 10:08 AM How to contact the [facility] Attending or Consulting provider 7A  - 7P or covering provider during after hours 7P -7A, for this patient?  Check the care team in [facility] and look for a) attending/consulting [facility] provider listed and b) the TRH team listed Log into [email or web address] and use [facility]'s universal password to access. If you do not have the password, please contact the hospital operator. Locate the TRH provider you are looking for under [facility] and page to a number that you can be directly reached. If you still have difficulty reaching the provider, please page the [facility] (Director on Call) for the Hospitalists listed on amion for assistance.

## 2023-07-07 NOTE — TOC Transition Note (Signed)
 Transition of Care [facility]) - Discharge Note   Patient Details  Name: Dana [name] MRN: [id number] Date of Birth: 1927/04/16  Transition of [facility]) CM/SW Contact:  Orelia Binet, RN Phone Number: 07/07/2023, 2:04 PM   Clinical Narrative:    Patient discharged home, Family requesting home health. MD order. Referral sent out. Cory with [facility] accepting.      Discharge Plan and Services Additional resources added to the After Visit Summary for         Social Drivers of Health (SDOH) Interventions SDOH Screenings   Food Insecurity: No Food Insecurity (07/05/2023)  Housing: Low Risk  (07/05/2023)  Transportation Needs: No Transportation Needs (07/05/2023)  Utilities: Not At Risk (07/05/2023)  Social Connections: Patient Unable To Answer (07/05/2023)  Tobacco Use: Low Risk  (07/05/2023)  Health Literacy: High Risk (05/06/2022)   Received from [facility]

## 2023-07-11 DIAGNOSIS — Z8673 Personal history of transient ischemic attack (TIA), and cerebral infarction without residual deficits: Secondary | ICD-10-CM | POA: Diagnosis not present

## 2023-07-11 DIAGNOSIS — E7849 Other hyperlipidemia: Secondary | ICD-10-CM | POA: Diagnosis not present

## 2023-07-11 DIAGNOSIS — Z9181 History of falling: Secondary | ICD-10-CM | POA: Diagnosis not present

## 2023-07-11 DIAGNOSIS — I129 Hypertensive chronic kidney disease with stage 1 through stage 4 chronic kidney disease, or unspecified chronic kidney disease: Secondary | ICD-10-CM | POA: Diagnosis not present

## 2023-07-11 DIAGNOSIS — I4892 Unspecified atrial flutter: Secondary | ICD-10-CM | POA: Diagnosis not present

## 2023-07-11 DIAGNOSIS — E1151 Type 2 diabetes mellitus with diabetic peripheral angiopathy without gangrene: Secondary | ICD-10-CM | POA: Diagnosis not present

## 2023-07-11 DIAGNOSIS — I4891 Unspecified atrial fibrillation: Secondary | ICD-10-CM | POA: Diagnosis not present

## 2023-07-11 DIAGNOSIS — N1832 Chronic kidney disease, stage 3b: Secondary | ICD-10-CM | POA: Diagnosis not present

## 2023-07-11 DIAGNOSIS — Z7901 Long term (current) use of anticoagulants: Secondary | ICD-10-CM | POA: Diagnosis not present

## 2023-07-13 DIAGNOSIS — E1151 Type 2 diabetes mellitus with diabetic peripheral angiopathy without gangrene: Secondary | ICD-10-CM | POA: Diagnosis not present

## 2023-07-13 DIAGNOSIS — E7849 Other hyperlipidemia: Secondary | ICD-10-CM | POA: Diagnosis not present

## 2023-07-13 DIAGNOSIS — Z9181 History of falling: Secondary | ICD-10-CM | POA: Diagnosis not present

## 2023-07-13 DIAGNOSIS — I129 Hypertensive chronic kidney disease with stage 1 through stage 4 chronic kidney disease, or unspecified chronic kidney disease: Secondary | ICD-10-CM | POA: Diagnosis not present

## 2023-07-13 DIAGNOSIS — Z7901 Long term (current) use of anticoagulants: Secondary | ICD-10-CM | POA: Diagnosis not present

## 2023-07-13 DIAGNOSIS — I4892 Unspecified atrial flutter: Secondary | ICD-10-CM | POA: Diagnosis not present

## 2023-07-13 DIAGNOSIS — Z8673 Personal history of transient ischemic attack (TIA), and cerebral infarction without residual deficits: Secondary | ICD-10-CM | POA: Diagnosis not present

## 2023-07-13 DIAGNOSIS — N1832 Chronic kidney disease, stage 3b: Secondary | ICD-10-CM | POA: Diagnosis not present

## 2023-07-13 DIAGNOSIS — I4891 Unspecified atrial fibrillation: Secondary | ICD-10-CM | POA: Diagnosis not present

## 2023-07-14 DIAGNOSIS — I10 Essential (primary) hypertension: Secondary | ICD-10-CM | POA: Diagnosis not present

## 2023-07-14 DIAGNOSIS — M545 Low back pain, unspecified: Secondary | ICD-10-CM | POA: Diagnosis not present

## 2023-07-14 DIAGNOSIS — Z299 Encounter for prophylactic measures, unspecified: Secondary | ICD-10-CM | POA: Diagnosis not present

## 2023-07-14 DIAGNOSIS — I4891 Unspecified atrial fibrillation: Secondary | ICD-10-CM | POA: Diagnosis not present

## 2023-07-16 DIAGNOSIS — Z9181 History of falling: Secondary | ICD-10-CM | POA: Diagnosis not present

## 2023-07-16 DIAGNOSIS — N1832 Chronic kidney disease, stage 3b: Secondary | ICD-10-CM | POA: Diagnosis not present

## 2023-07-16 DIAGNOSIS — Z8673 Personal history of transient ischemic attack (TIA), and cerebral infarction without residual deficits: Secondary | ICD-10-CM | POA: Diagnosis not present

## 2023-07-16 DIAGNOSIS — E1151 Type 2 diabetes mellitus with diabetic peripheral angiopathy without gangrene: Secondary | ICD-10-CM | POA: Diagnosis not present

## 2023-07-16 DIAGNOSIS — I4892 Unspecified atrial flutter: Secondary | ICD-10-CM | POA: Diagnosis not present

## 2023-07-16 DIAGNOSIS — E7849 Other hyperlipidemia: Secondary | ICD-10-CM | POA: Diagnosis not present

## 2023-07-16 DIAGNOSIS — I4891 Unspecified atrial fibrillation: Secondary | ICD-10-CM | POA: Diagnosis not present

## 2023-07-16 DIAGNOSIS — Z7901 Long term (current) use of anticoagulants: Secondary | ICD-10-CM | POA: Diagnosis not present

## 2023-07-16 DIAGNOSIS — I129 Hypertensive chronic kidney disease with stage 1 through stage 4 chronic kidney disease, or unspecified chronic kidney disease: Secondary | ICD-10-CM | POA: Diagnosis not present

## 2023-07-21 DIAGNOSIS — I4892 Unspecified atrial flutter: Secondary | ICD-10-CM | POA: Diagnosis not present

## 2023-07-21 DIAGNOSIS — Z7901 Long term (current) use of anticoagulants: Secondary | ICD-10-CM | POA: Diagnosis not present

## 2023-07-21 DIAGNOSIS — Z8673 Personal history of transient ischemic attack (TIA), and cerebral infarction without residual deficits: Secondary | ICD-10-CM | POA: Diagnosis not present

## 2023-07-21 DIAGNOSIS — I129 Hypertensive chronic kidney disease with stage 1 through stage 4 chronic kidney disease, or unspecified chronic kidney disease: Secondary | ICD-10-CM | POA: Diagnosis not present

## 2023-07-21 DIAGNOSIS — Z9181 History of falling: Secondary | ICD-10-CM | POA: Diagnosis not present

## 2023-07-21 DIAGNOSIS — I4891 Unspecified atrial fibrillation: Secondary | ICD-10-CM | POA: Diagnosis not present

## 2023-07-21 DIAGNOSIS — N1832 Chronic kidney disease, stage 3b: Secondary | ICD-10-CM | POA: Diagnosis not present

## 2023-07-21 DIAGNOSIS — E7849 Other hyperlipidemia: Secondary | ICD-10-CM | POA: Diagnosis not present

## 2023-07-21 DIAGNOSIS — E1151 Type 2 diabetes mellitus with diabetic peripheral angiopathy without gangrene: Secondary | ICD-10-CM | POA: Diagnosis not present

## 2023-07-22 DIAGNOSIS — E1151 Type 2 diabetes mellitus with diabetic peripheral angiopathy without gangrene: Secondary | ICD-10-CM | POA: Diagnosis not present

## 2023-07-22 DIAGNOSIS — Z9181 History of falling: Secondary | ICD-10-CM | POA: Diagnosis not present

## 2023-07-22 DIAGNOSIS — I129 Hypertensive chronic kidney disease with stage 1 through stage 4 chronic kidney disease, or unspecified chronic kidney disease: Secondary | ICD-10-CM | POA: Diagnosis not present

## 2023-07-22 DIAGNOSIS — Z7901 Long term (current) use of anticoagulants: Secondary | ICD-10-CM | POA: Diagnosis not present

## 2023-07-22 DIAGNOSIS — E7849 Other hyperlipidemia: Secondary | ICD-10-CM | POA: Diagnosis not present

## 2023-07-22 DIAGNOSIS — I4891 Unspecified atrial fibrillation: Secondary | ICD-10-CM | POA: Diagnosis not present

## 2023-07-22 DIAGNOSIS — I4892 Unspecified atrial flutter: Secondary | ICD-10-CM | POA: Diagnosis not present

## 2023-07-22 DIAGNOSIS — Z8673 Personal history of transient ischemic attack (TIA), and cerebral infarction without residual deficits: Secondary | ICD-10-CM | POA: Diagnosis not present

## 2023-07-22 DIAGNOSIS — N1832 Chronic kidney disease, stage 3b: Secondary | ICD-10-CM | POA: Diagnosis not present

## 2023-07-23 DIAGNOSIS — E1151 Type 2 diabetes mellitus with diabetic peripheral angiopathy without gangrene: Secondary | ICD-10-CM | POA: Diagnosis not present

## 2023-07-23 DIAGNOSIS — I4892 Unspecified atrial flutter: Secondary | ICD-10-CM | POA: Diagnosis not present

## 2023-07-23 DIAGNOSIS — I129 Hypertensive chronic kidney disease with stage 1 through stage 4 chronic kidney disease, or unspecified chronic kidney disease: Secondary | ICD-10-CM | POA: Diagnosis not present

## 2023-07-23 DIAGNOSIS — Z7901 Long term (current) use of anticoagulants: Secondary | ICD-10-CM | POA: Diagnosis not present

## 2023-07-23 DIAGNOSIS — Z9181 History of falling: Secondary | ICD-10-CM | POA: Diagnosis not present

## 2023-07-23 DIAGNOSIS — Z8673 Personal history of transient ischemic attack (TIA), and cerebral infarction without residual deficits: Secondary | ICD-10-CM | POA: Diagnosis not present

## 2023-07-23 DIAGNOSIS — E7849 Other hyperlipidemia: Secondary | ICD-10-CM | POA: Diagnosis not present

## 2023-07-23 DIAGNOSIS — I4891 Unspecified atrial fibrillation: Secondary | ICD-10-CM | POA: Diagnosis not present

## 2023-07-23 DIAGNOSIS — N1832 Chronic kidney disease, stage 3b: Secondary | ICD-10-CM | POA: Diagnosis not present

## 2023-07-25 DIAGNOSIS — I10 Essential (primary) hypertension: Secondary | ICD-10-CM | POA: Diagnosis not present

## 2023-07-27 DIAGNOSIS — I4891 Unspecified atrial fibrillation: Secondary | ICD-10-CM | POA: Diagnosis not present

## 2023-07-27 DIAGNOSIS — N1832 Chronic kidney disease, stage 3b: Secondary | ICD-10-CM | POA: Diagnosis not present

## 2023-07-27 DIAGNOSIS — I4892 Unspecified atrial flutter: Secondary | ICD-10-CM | POA: Diagnosis not present

## 2023-07-27 DIAGNOSIS — I129 Hypertensive chronic kidney disease with stage 1 through stage 4 chronic kidney disease, or unspecified chronic kidney disease: Secondary | ICD-10-CM | POA: Diagnosis not present

## 2023-07-28 DIAGNOSIS — I129 Hypertensive chronic kidney disease with stage 1 through stage 4 chronic kidney disease, or unspecified chronic kidney disease: Secondary | ICD-10-CM | POA: Diagnosis not present

## 2023-07-28 DIAGNOSIS — N1832 Chronic kidney disease, stage 3b: Secondary | ICD-10-CM | POA: Diagnosis not present

## 2023-07-28 DIAGNOSIS — E1151 Type 2 diabetes mellitus with diabetic peripheral angiopathy without gangrene: Secondary | ICD-10-CM | POA: Diagnosis not present

## 2023-07-28 DIAGNOSIS — I4892 Unspecified atrial flutter: Secondary | ICD-10-CM | POA: Diagnosis not present

## 2023-07-28 DIAGNOSIS — Z9181 History of falling: Secondary | ICD-10-CM | POA: Diagnosis not present

## 2023-07-28 DIAGNOSIS — Z7901 Long term (current) use of anticoagulants: Secondary | ICD-10-CM | POA: Diagnosis not present

## 2023-07-28 DIAGNOSIS — E7849 Other hyperlipidemia: Secondary | ICD-10-CM | POA: Diagnosis not present

## 2023-07-28 DIAGNOSIS — Z8673 Personal history of transient ischemic attack (TIA), and cerebral infarction without residual deficits: Secondary | ICD-10-CM | POA: Diagnosis not present

## 2023-07-28 DIAGNOSIS — I4891 Unspecified atrial fibrillation: Secondary | ICD-10-CM | POA: Diagnosis not present

## 2023-07-29 DIAGNOSIS — Z9181 History of falling: Secondary | ICD-10-CM | POA: Diagnosis not present

## 2023-07-29 DIAGNOSIS — I4891 Unspecified atrial fibrillation: Secondary | ICD-10-CM | POA: Diagnosis not present

## 2023-07-29 DIAGNOSIS — I129 Hypertensive chronic kidney disease with stage 1 through stage 4 chronic kidney disease, or unspecified chronic kidney disease: Secondary | ICD-10-CM | POA: Diagnosis not present

## 2023-07-29 DIAGNOSIS — N1832 Chronic kidney disease, stage 3b: Secondary | ICD-10-CM | POA: Diagnosis not present

## 2023-07-29 DIAGNOSIS — E1151 Type 2 diabetes mellitus with diabetic peripheral angiopathy without gangrene: Secondary | ICD-10-CM | POA: Diagnosis not present

## 2023-07-29 DIAGNOSIS — Z7901 Long term (current) use of anticoagulants: Secondary | ICD-10-CM | POA: Diagnosis not present

## 2023-07-29 DIAGNOSIS — Z8673 Personal history of transient ischemic attack (TIA), and cerebral infarction without residual deficits: Secondary | ICD-10-CM | POA: Diagnosis not present

## 2023-07-29 DIAGNOSIS — I4892 Unspecified atrial flutter: Secondary | ICD-10-CM | POA: Diagnosis not present

## 2023-07-29 DIAGNOSIS — E7849 Other hyperlipidemia: Secondary | ICD-10-CM | POA: Diagnosis not present

## 2023-08-04 DIAGNOSIS — Z9181 History of falling: Secondary | ICD-10-CM | POA: Diagnosis not present

## 2023-08-04 DIAGNOSIS — E7849 Other hyperlipidemia: Secondary | ICD-10-CM | POA: Diagnosis not present

## 2023-08-04 DIAGNOSIS — N1832 Chronic kidney disease, stage 3b: Secondary | ICD-10-CM | POA: Diagnosis not present

## 2023-08-04 DIAGNOSIS — I129 Hypertensive chronic kidney disease with stage 1 through stage 4 chronic kidney disease, or unspecified chronic kidney disease: Secondary | ICD-10-CM | POA: Diagnosis not present

## 2023-08-04 DIAGNOSIS — Z7901 Long term (current) use of anticoagulants: Secondary | ICD-10-CM | POA: Diagnosis not present

## 2023-08-04 DIAGNOSIS — Z8673 Personal history of transient ischemic attack (TIA), and cerebral infarction without residual deficits: Secondary | ICD-10-CM | POA: Diagnosis not present

## 2023-08-04 DIAGNOSIS — E1151 Type 2 diabetes mellitus with diabetic peripheral angiopathy without gangrene: Secondary | ICD-10-CM | POA: Diagnosis not present

## 2023-08-04 DIAGNOSIS — I4892 Unspecified atrial flutter: Secondary | ICD-10-CM | POA: Diagnosis not present

## 2023-08-04 DIAGNOSIS — I4891 Unspecified atrial fibrillation: Secondary | ICD-10-CM | POA: Diagnosis not present

## 2023-08-05 DIAGNOSIS — E7849 Other hyperlipidemia: Secondary | ICD-10-CM | POA: Diagnosis not present

## 2023-08-05 DIAGNOSIS — Z7901 Long term (current) use of anticoagulants: Secondary | ICD-10-CM | POA: Diagnosis not present

## 2023-08-05 DIAGNOSIS — Z8673 Personal history of transient ischemic attack (TIA), and cerebral infarction without residual deficits: Secondary | ICD-10-CM | POA: Diagnosis not present

## 2023-08-05 DIAGNOSIS — E1151 Type 2 diabetes mellitus with diabetic peripheral angiopathy without gangrene: Secondary | ICD-10-CM | POA: Diagnosis not present

## 2023-08-05 DIAGNOSIS — I129 Hypertensive chronic kidney disease with stage 1 through stage 4 chronic kidney disease, or unspecified chronic kidney disease: Secondary | ICD-10-CM | POA: Diagnosis not present

## 2023-08-05 DIAGNOSIS — I4892 Unspecified atrial flutter: Secondary | ICD-10-CM | POA: Diagnosis not present

## 2023-08-05 DIAGNOSIS — Z9181 History of falling: Secondary | ICD-10-CM | POA: Diagnosis not present

## 2023-08-05 DIAGNOSIS — N1832 Chronic kidney disease, stage 3b: Secondary | ICD-10-CM | POA: Diagnosis not present

## 2023-08-05 DIAGNOSIS — I4891 Unspecified atrial fibrillation: Secondary | ICD-10-CM | POA: Diagnosis not present

## 2023-08-12 DIAGNOSIS — E7849 Other hyperlipidemia: Secondary | ICD-10-CM | POA: Diagnosis not present

## 2023-08-12 DIAGNOSIS — I129 Hypertensive chronic kidney disease with stage 1 through stage 4 chronic kidney disease, or unspecified chronic kidney disease: Secondary | ICD-10-CM | POA: Diagnosis not present

## 2023-08-12 DIAGNOSIS — N1832 Chronic kidney disease, stage 3b: Secondary | ICD-10-CM | POA: Diagnosis not present

## 2023-08-12 DIAGNOSIS — Z8673 Personal history of transient ischemic attack (TIA), and cerebral infarction without residual deficits: Secondary | ICD-10-CM | POA: Diagnosis not present

## 2023-08-12 DIAGNOSIS — Z7901 Long term (current) use of anticoagulants: Secondary | ICD-10-CM | POA: Diagnosis not present

## 2023-08-12 DIAGNOSIS — I4892 Unspecified atrial flutter: Secondary | ICD-10-CM | POA: Diagnosis not present

## 2023-08-12 DIAGNOSIS — I4891 Unspecified atrial fibrillation: Secondary | ICD-10-CM | POA: Diagnosis not present

## 2023-08-12 DIAGNOSIS — E1151 Type 2 diabetes mellitus with diabetic peripheral angiopathy without gangrene: Secondary | ICD-10-CM | POA: Diagnosis not present

## 2023-08-12 DIAGNOSIS — Z9181 History of falling: Secondary | ICD-10-CM | POA: Diagnosis not present

## 2023-08-19 DIAGNOSIS — I4892 Unspecified atrial flutter: Secondary | ICD-10-CM | POA: Diagnosis not present

## 2023-08-19 DIAGNOSIS — Z7901 Long term (current) use of anticoagulants: Secondary | ICD-10-CM | POA: Diagnosis not present

## 2023-08-19 DIAGNOSIS — I4891 Unspecified atrial fibrillation: Secondary | ICD-10-CM | POA: Diagnosis not present

## 2023-08-19 DIAGNOSIS — Z9181 History of falling: Secondary | ICD-10-CM | POA: Diagnosis not present

## 2023-08-19 DIAGNOSIS — N1832 Chronic kidney disease, stage 3b: Secondary | ICD-10-CM | POA: Diagnosis not present

## 2023-08-19 DIAGNOSIS — E7849 Other hyperlipidemia: Secondary | ICD-10-CM | POA: Diagnosis not present

## 2023-08-19 DIAGNOSIS — Z8673 Personal history of transient ischemic attack (TIA), and cerebral infarction without residual deficits: Secondary | ICD-10-CM | POA: Diagnosis not present

## 2023-08-19 DIAGNOSIS — I129 Hypertensive chronic kidney disease with stage 1 through stage 4 chronic kidney disease, or unspecified chronic kidney disease: Secondary | ICD-10-CM | POA: Diagnosis not present

## 2023-08-19 DIAGNOSIS — E1151 Type 2 diabetes mellitus with diabetic peripheral angiopathy without gangrene: Secondary | ICD-10-CM | POA: Diagnosis not present

## 2023-08-21 DIAGNOSIS — Z299 Encounter for prophylactic measures, unspecified: Secondary | ICD-10-CM | POA: Diagnosis not present

## 2023-08-21 DIAGNOSIS — R42 Dizziness and giddiness: Secondary | ICD-10-CM | POA: Diagnosis not present

## 2023-08-21 DIAGNOSIS — I10 Essential (primary) hypertension: Secondary | ICD-10-CM | POA: Diagnosis not present

## 2023-08-24 DIAGNOSIS — I10 Essential (primary) hypertension: Secondary | ICD-10-CM | POA: Diagnosis not present

## 2023-08-25 DIAGNOSIS — Z961 Presence of intraocular lens: Secondary | ICD-10-CM | POA: Diagnosis not present

## 2023-08-25 DIAGNOSIS — H401124 Primary open-angle glaucoma, left eye, indeterminate stage: Secondary | ICD-10-CM | POA: Diagnosis not present

## 2023-08-26 DIAGNOSIS — N1832 Chronic kidney disease, stage 3b: Secondary | ICD-10-CM | POA: Diagnosis not present

## 2023-08-26 DIAGNOSIS — Z8673 Personal history of transient ischemic attack (TIA), and cerebral infarction without residual deficits: Secondary | ICD-10-CM | POA: Diagnosis not present

## 2023-08-26 DIAGNOSIS — Z7901 Long term (current) use of anticoagulants: Secondary | ICD-10-CM | POA: Diagnosis not present

## 2023-08-26 DIAGNOSIS — Z9181 History of falling: Secondary | ICD-10-CM | POA: Diagnosis not present

## 2023-08-26 DIAGNOSIS — I129 Hypertensive chronic kidney disease with stage 1 through stage 4 chronic kidney disease, or unspecified chronic kidney disease: Secondary | ICD-10-CM | POA: Diagnosis not present

## 2023-08-26 DIAGNOSIS — I4891 Unspecified atrial fibrillation: Secondary | ICD-10-CM | POA: Diagnosis not present

## 2023-08-26 DIAGNOSIS — I4892 Unspecified atrial flutter: Secondary | ICD-10-CM | POA: Diagnosis not present

## 2023-08-26 DIAGNOSIS — E1151 Type 2 diabetes mellitus with diabetic peripheral angiopathy without gangrene: Secondary | ICD-10-CM | POA: Diagnosis not present

## 2023-08-26 DIAGNOSIS — E7849 Other hyperlipidemia: Secondary | ICD-10-CM | POA: Diagnosis not present

## 2023-09-03 DIAGNOSIS — I129 Hypertensive chronic kidney disease with stage 1 through stage 4 chronic kidney disease, or unspecified chronic kidney disease: Secondary | ICD-10-CM | POA: Diagnosis not present

## 2023-09-03 DIAGNOSIS — Z8673 Personal history of transient ischemic attack (TIA), and cerebral infarction without residual deficits: Secondary | ICD-10-CM | POA: Diagnosis not present

## 2023-09-03 DIAGNOSIS — I4892 Unspecified atrial flutter: Secondary | ICD-10-CM | POA: Diagnosis not present

## 2023-09-03 DIAGNOSIS — E7849 Other hyperlipidemia: Secondary | ICD-10-CM | POA: Diagnosis not present

## 2023-09-03 DIAGNOSIS — Z7901 Long term (current) use of anticoagulants: Secondary | ICD-10-CM | POA: Diagnosis not present

## 2023-09-03 DIAGNOSIS — I4891 Unspecified atrial fibrillation: Secondary | ICD-10-CM | POA: Diagnosis not present

## 2023-09-03 DIAGNOSIS — E1151 Type 2 diabetes mellitus with diabetic peripheral angiopathy without gangrene: Secondary | ICD-10-CM | POA: Diagnosis not present

## 2023-09-03 DIAGNOSIS — Z9181 History of falling: Secondary | ICD-10-CM | POA: Diagnosis not present

## 2023-09-03 DIAGNOSIS — N1832 Chronic kidney disease, stage 3b: Secondary | ICD-10-CM | POA: Diagnosis not present

## 2023-09-07 ENCOUNTER — Ambulatory Visit: Attending: Internal Medicine | Admitting: Internal Medicine

## 2023-09-07 ENCOUNTER — Telehealth: Payer: Self-pay | Admitting: Internal Medicine

## 2023-09-07 VITALS — BP 130/68 | HR 65 | Ht 63.0 in | Wt 130.0 lb

## 2023-09-07 DIAGNOSIS — I4892 Unspecified atrial flutter: Secondary | ICD-10-CM | POA: Diagnosis not present

## 2023-09-07 DIAGNOSIS — I10 Essential (primary) hypertension: Secondary | ICD-10-CM | POA: Diagnosis not present

## 2023-09-07 DIAGNOSIS — I2510 Atherosclerotic heart disease of native coronary artery without angina pectoris: Secondary | ICD-10-CM | POA: Insufficient documentation

## 2023-09-07 DIAGNOSIS — I480 Paroxysmal atrial fibrillation: Secondary | ICD-10-CM | POA: Diagnosis not present

## 2023-09-07 DIAGNOSIS — Z8673 Personal history of transient ischemic attack (TIA), and cerebral infarction without residual deficits: Secondary | ICD-10-CM | POA: Diagnosis not present

## 2023-09-07 DIAGNOSIS — E7800 Pure hypercholesterolemia, unspecified: Secondary | ICD-10-CM

## 2023-09-07 DIAGNOSIS — R42 Dizziness and giddiness: Secondary | ICD-10-CM | POA: Insufficient documentation

## 2023-09-07 NOTE — Progress Notes (Signed)
 Cardiology Office Note  Date: 09/07/2023   ID: Dana [name] 10-Aug-1927, MRN [id number]  PCP:  Rosamond Leta NOVAK, MD  Cardiologist:  None Electrophysiologist:  None   History of Present Illness: Dana [name] is a 89 y.o. female  Referred to cardiology clinic to establish care.  Collateral history is obtained from her daughter.  Records from Care Everywhere reviewed.  Echocardiogram from 2024 showed normal LVEF and no valvular heart disease except for trivial aortic valve regurgitation.  She was newly diagnosed with atrial fibrillation with RVR when she was admitted to the hospital around 2021.  She recently had a fall and admitted to [facility] in May 2025 and had A-fib with RVR, stabilized on diltiazem  and converted to NSR.  Due to elevated blood pressures, she was started on hydralazine  in the hospital but recently her PCP stopped hydralazine  and metoprolol  due to dizziness from low blood pressures.  She is doing well since then except for few dizzy spells here and there.  No angina or DOE.  No syncope.  No palpitations.  Patient lives independently.  Past Medical History:  Diagnosis Date   Anxiety state, unspecified    Chronic kidney disease, stage III (moderate) (HCC)    Encounter for long-term (current) use of other medications    Headache(784.0)    HTN (hypertension)    Other and unspecified hyperlipidemia    Peripheral vascular disease, unspecified (HCC)    Pure hypercholesterolemia    Transient ischemic attack (TIA), and cerebral infarction without residual deficits(V12.54)    Type II or unspecified type diabetes mellitus without mention of complication, not stated as uncontrolled    Unspecified hypertensive kidney disease with chronic kidney disease stage I through stage IV, or unspecified(403.90)    Unspecified transient cerebral ischemia     Past Surgical History:  Procedure Laterality Date   CATARACT SURGERY     CHOLECYSTECTOMY      Current Outpatient  Medications  Medication Sig Dispense Refill   amLODipine  (NORVASC ) 5 MG tablet Take 1 tablet (5 mg total) by mouth daily. 30 tablet 1   apixaban  (ELIQUIS ) 2.5 MG TABS tablet Take 2.5 mg by mouth 2 (two) times daily.     citalopram (CELEXA) 20 MG tablet Take 20 mg by mouth daily.     dorzolamide-timolol (COSOPT) 2-0.5 % ophthalmic solution Place 1 drop into the left eye 2 (two) times daily.     metoprolol  succinate (TOPROL -XL) 25 MG 24 hr tablet Take 1 tablet (25 mg total) by mouth daily. 30 tablet 1   Multiple Vitamin (MULTI-VITAMIN) tablet Take 1 tablet by mouth daily.     omeprazole (PRILOSEC) 20 MG capsule Take 20 mg by mouth daily.     simvastatin (ZOCOR) 20 MG tablet Take 20 mg by mouth every evening.     traMADol (ULTRAM) 50 MG tablet Take 25-50 mg by mouth 2 (two) times daily as needed.     VYZULTA 0.024 % SOLN Place 1 drop into both eyes at bedtime.     No current facility-administered medications for this visit.   Allergies:  Gabapentin  and Paxil [paroxetine hcl]   Social History: The patient  reports that she has never smoked. She has never used smokeless tobacco. She reports that she does not drink alcohol and does not use drugs.   Family History: The patient's family history includes Cancer in her brother, daughter, father, and sister; Diabetes in her father and mother.   ROS:  Please see the history of  present illness. Otherwise, complete review of systems is positive for none  All other systems are reviewed and negative.   Physical Exam: VS:  BP 130/68 (BP Location: Left Arm, Patient Position: Sitting)   Pulse 65   Ht 5' 3 (1.6 m)   Wt 130 lb (59 kg)   SpO2 98%   BMI 23.03 kg/m , BMI Body mass index is 23.03 kg/m.  Wt Readings from Last 3 Encounters:  09/07/23 130 lb (59 kg)  07/05/23 135 lb (61.2 kg)  04/29/22 131 lb 8 oz (59.6 kg)    General: Patient appears comfortable at rest. HEENT: Conjunctiva and lids normal, oropharynx clear with moist mucosa. Neck:  Supple, no elevated JVP or carotid bruits, no thyromegaly. Lungs: Clear to auscultation, nonlabored breathing at rest. Cardiac: Regular rate and rhythm, no S3 or significant systolic murmur, no pericardial rub. Abdomen: Soft, nontender, no hepatomegaly, bowel sounds present, no guarding or rebound. Extremities: No pitting edema, distal pulses 2+. Skin: Warm and dry. Musculoskeletal: No kyphosis. Neuropsychiatric: Alert and oriented x3, affect grossly appropriate.  Recent Labwork: 07/05/2023: ALT 15; AST 24; B Natriuretic Peptide 308.0 07/07/2023: BUN 17; Creatinine, Ser 0.96; Hemoglobin 13.1; Magnesium  2.2; Platelets 240; Potassium 3.8; Sodium 137  No results found for: CHOL, TRIG, HDL, CHOLHDL, VLDL, LDLCALC, LDLDIRECT   Assessment and Plan:  Paroxysmal A-fib: Asymptomatic. EKG today showed normal sinus rhythm with HR 65 bpm.  Not on rate controlling agents.  Continue Eliquis  2.5 mg twice daily.  Dizziness: Previously on hydralazine  and metoprolol  which was discontinued by PCP due to dizziness and low blood pressures.  blood pressures seem to be better controlled but still has a few dizzy spells.  Home blood pressures reviewed, ranging between 90 and 110s.  EKG today showed normal sinus rhythm, HR 65 bpm.  Obtain 2-week event monitor, live.  Echocardiogram from Feb 2024 showed normal LVEF and no valvular heart disease.  Mild nonobstructive CAD by LHC in 2017: No angina or DOE.  30% LAD stenosis on LHC.  Continue simvastatin 20 mg nightly.  HTN, controlled: Continue amlodipine  5 mg once daily.  Previously on hydralazine  and metoprolol  that were discontinued by PCP due to dizziness from low blood pressures.  History of multiple TIA: Continue Eliquis  2.5 mg twice daily.       Medication Adjustments/Labs and Tests Ordered: Current medicines are reviewed at length with the patient today.  Concerns regarding medicines are outlined above.    Disposition:  Follow up 1  year  Signed Shakim Faith Priya Pallas Wahlert, MD, 09/07/2023 2:11 PM    [facility] [address] [place], [place], [place] [address]

## 2023-09-07 NOTE — Patient Instructions (Addendum)
 Medication Instructions:   Continue all current medications.   Labwork:  none  Testing/Procedures:  Your physician has recommended that you wear a 14 day event monitor. Event monitors are medical devices that record the heart's electrical activity. Doctors most often us  these monitors to diagnose arrhythmias. Arrhythmias are problems with the speed or rhythm of the heartbeat. The monitor is a small, portable device. You can wear one while you do your normal daily activities. This is usually used to diagnose what is causing palpitations/syncope (passing out).  Office will contact with results via phone, letter or mychart.     Follow-Up:  Your physician wants you to follow up in:  1 year.  You should receive a recall letter in the mail about 2 months prior to the time you are due.  If you don't receive this, please call our office to schedule your follow up appointment.      Any Other Special Instructions Will Be Listed Below (If Applicable).   If you need a refill on your cardiac medications before your next appointment, please call your pharmacy.

## 2023-09-07 NOTE — Telephone Encounter (Signed)
 Checking percert on the following   14 day live zio - dizziness

## 2023-09-08 ENCOUNTER — Other Ambulatory Visit

## 2023-09-08 ENCOUNTER — Other Ambulatory Visit: Payer: Self-pay | Admitting: Internal Medicine

## 2023-09-08 DIAGNOSIS — R42 Dizziness and giddiness: Secondary | ICD-10-CM

## 2023-09-09 DIAGNOSIS — R42 Dizziness and giddiness: Secondary | ICD-10-CM | POA: Diagnosis not present

## 2023-09-24 DIAGNOSIS — I10 Essential (primary) hypertension: Secondary | ICD-10-CM | POA: Diagnosis not present

## 2023-10-02 DIAGNOSIS — Z0000 Encounter for general adult medical examination without abnormal findings: Secondary | ICD-10-CM | POA: Diagnosis not present

## 2023-10-02 DIAGNOSIS — I10 Essential (primary) hypertension: Secondary | ICD-10-CM | POA: Diagnosis not present

## 2023-10-02 DIAGNOSIS — I4891 Unspecified atrial fibrillation: Secondary | ICD-10-CM | POA: Diagnosis not present

## 2023-10-02 DIAGNOSIS — Z1389 Encounter for screening for other disorder: Secondary | ICD-10-CM | POA: Diagnosis not present

## 2023-10-02 DIAGNOSIS — Z7189 Other specified counseling: Secondary | ICD-10-CM | POA: Diagnosis not present

## 2023-10-02 DIAGNOSIS — Z299 Encounter for prophylactic measures, unspecified: Secondary | ICD-10-CM | POA: Diagnosis not present

## 2023-10-02 DIAGNOSIS — N1832 Chronic kidney disease, stage 3b: Secondary | ICD-10-CM | POA: Diagnosis not present

## 2023-10-20 DIAGNOSIS — R42 Dizziness and giddiness: Secondary | ICD-10-CM | POA: Diagnosis not present

## 2023-10-24 DIAGNOSIS — I10 Essential (primary) hypertension: Secondary | ICD-10-CM | POA: Diagnosis not present

## 2023-11-05 ENCOUNTER — Ambulatory Visit: Payer: Self-pay | Admitting: Internal Medicine

## 2023-11-24 DIAGNOSIS — I10 Essential (primary) hypertension: Secondary | ICD-10-CM | POA: Diagnosis not present

## 2023-12-10 DIAGNOSIS — Z299 Encounter for prophylactic measures, unspecified: Secondary | ICD-10-CM | POA: Diagnosis not present

## 2023-12-10 DIAGNOSIS — E7800 Pure hypercholesterolemia, unspecified: Secondary | ICD-10-CM | POA: Diagnosis not present

## 2023-12-10 DIAGNOSIS — Z23 Encounter for immunization: Secondary | ICD-10-CM | POA: Diagnosis not present

## 2023-12-10 DIAGNOSIS — R5383 Other fatigue: Secondary | ICD-10-CM | POA: Diagnosis not present

## 2023-12-10 DIAGNOSIS — Z0000 Encounter for general adult medical examination without abnormal findings: Secondary | ICD-10-CM | POA: Diagnosis not present

## 2023-12-10 DIAGNOSIS — R42 Dizziness and giddiness: Secondary | ICD-10-CM | POA: Diagnosis not present

## 2023-12-10 DIAGNOSIS — Z79899 Other long term (current) drug therapy: Secondary | ICD-10-CM | POA: Diagnosis not present

## 2023-12-10 DIAGNOSIS — C44321 Squamous cell carcinoma of skin of nose: Secondary | ICD-10-CM | POA: Diagnosis not present
